# Patient Record
Sex: Male | Born: 1970
Health system: Southern US, Community
[De-identification: ages and names within clinical notes are randomized; demographics above are authoritative.]

## PROBLEM LIST (undated history)

## (undated) DIAGNOSIS — F32A Depression, unspecified: Secondary | ICD-10-CM

## (undated) DIAGNOSIS — E785 Hyperlipidemia, unspecified: Secondary | ICD-10-CM

## (undated) DIAGNOSIS — F419 Anxiety disorder, unspecified: Secondary | ICD-10-CM

## (undated) DIAGNOSIS — I10 Essential (primary) hypertension: Secondary | ICD-10-CM

## (undated) DIAGNOSIS — F329 Major depressive disorder, single episode, unspecified: Secondary | ICD-10-CM

## (undated) HISTORY — DX: Essential (primary) hypertension: I10

## (undated) HISTORY — DX: Depression, unspecified: F32.A

## (undated) HISTORY — DX: Hyperlipidemia, unspecified: E78.5

## (undated) HISTORY — DX: Anxiety disorder, unspecified: F41.9

## (undated) HISTORY — DX: Major depressive disorder, single episode, unspecified: F32.9

---
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
## 1997-10-09 ENCOUNTER — Emergency Department (HOSPITAL_COMMUNITY): Admission: EM | Admit: 1997-10-09 | Discharge: 1997-10-09 | Payer: Self-pay | Admitting: Emergency Medicine

## 1999-11-08 ENCOUNTER — Emergency Department (HOSPITAL_COMMUNITY): Admission: EM | Admit: 1999-11-08 | Discharge: 1999-11-08 | Payer: Self-pay | Admitting: *Deleted

## 1999-11-08 ENCOUNTER — Encounter: Payer: Self-pay | Admitting: Emergency Medicine

## 1999-11-15 ENCOUNTER — Emergency Department (HOSPITAL_COMMUNITY): Admission: EM | Admit: 1999-11-15 | Discharge: 1999-11-15 | Payer: Self-pay | Admitting: Emergency Medicine

## 2001-10-07 ENCOUNTER — Emergency Department (HOSPITAL_COMMUNITY): Admission: EM | Admit: 2001-10-07 | Discharge: 2001-10-07 | Payer: Self-pay | Admitting: Emergency Medicine

## 2010-02-20 ENCOUNTER — Ambulatory Visit: Payer: Self-pay | Admitting: Cardiovascular Disease

## 2010-03-05 ENCOUNTER — Ambulatory Visit (HOSPITAL_COMMUNITY): Admission: RE | Admit: 2010-03-05 | Discharge: 2010-03-05 | Payer: Self-pay | Admitting: Cardiovascular Disease

## 2010-03-05 ENCOUNTER — Encounter: Payer: Self-pay | Admitting: Cardiovascular Disease

## 2010-03-05 ENCOUNTER — Ambulatory Visit: Payer: Self-pay | Admitting: Cardiology

## 2010-03-05 ENCOUNTER — Ambulatory Visit: Payer: Self-pay

## 2010-03-06 ENCOUNTER — Telehealth: Payer: Self-pay | Admitting: Cardiovascular Disease

## 2010-07-10 NOTE — Progress Notes (Signed)
  Phone Note Outgoing Call   Call placed by: Dessie Coma  LPN,  March 06, 2010 2:51 PM Call placed to: Patient Summary of Call: Patient notified per Dr. Kirke Corin, stress echo was normal.

## 2010-10-23 NOTE — Letter (Signed)
February 20, 2010    Dr. Marisue Brooklyn  [address]  [place], [place]  [address]   RE:  Bernard, Thompson  MRN:  [id number]  /  DOB:  [birth year]   Dear Dr. Elisabeth Most:   Thank you for referring Bernard Thompson for cardiac evaluation regarding his  recent chest pain.  As you are aware, this is a 40 year old gentleman  with no previous cardiac history.  He has the following problem list:  1. Anxiety and depression.  2. Hyperlipidemia.  3. Borderline hypertension.   CLINICAL HISTORY:  Bernard Thompson had some episodes of chest pain recently over  the last few weeks.  He describes chest tightness substernally with no  radiation.  The initial episode happened about 10 days ago at rest and  was not triggered by physical activities.  It lasted for a few hours.  He was out of town recently and had another episode.  [facility] and was transferred to the emergency room at that time.  He was  kept overnight with serial cardiac enzymes and a chest x-Prosise.  The basic  workup was nonrevealing.  It was recommended to him to get a stress  test.  The patient has no previous cardiac history.  He had a few milder  episodes since the emergency room visit but feels that it is triggered  by stress and anxiety.  He is able to do his physical activities without  significant limitations.  He does have mild exertional dyspnea.  He was  found recently to have hyperlipidemia and was prescribed Pravachol.  He  has not been taking the medication.   PAST MEDICAL HISTORY:  Is as outlined above.   MEDICATIONS:  Include citalopram 20 mg once daily.   ALLERGIES:  NO KNOWN DRUG ALLERGIES.   SOCIAL HISTORY:  He quit smoking in 1992.  He denies any alcohol or  recreational drug use.  The patient does not exercise.  He works as a  [employer]al [employer].   FAMILY HISTORY:  Is negative for premature coronary artery disease.   PAST SURGICAL HISTORY:  Foot surgery in 1985.   REVIEW OF SYSTEMS:  Is remarkable for chest pain and  mild dyspnea.  There is also anxiety.  He does have mild heartburn.  Full review of  systems was performed and is otherwise negative.   PHYSICAL EXAMINATION:  GENERAL:  The patient is pleasant and appears to  be in no acute distress.  VITAL SIGNS:  Weight 257, blood pressure is 155/92, pulse is 65, oxygen  saturation is 98% on room air.  HEENT:  Normocephalic, atraumatic.  NECK:  No JVD or carotid bruits.  RESPIRATORY:  Normal respiratory effort with no use of accessory  muscles.  Auscultation reveals normal breath sounds.  CARDIOVASCULAR:  Normal PMI.  Normal S1, S2 with no gallops or murmurs.  ABDOMEN:  Benign, nontender, nondistended.  EXTREMITIES:  With no clubbing, cyanosis or edema.  SKIN:  Warm and dry with no rash.  MUSCULOSKELETAL:  There is normal muscle strength in the upper and lower  extremities.   IMPRESSION:  1. Atypical chest pain:  This could be due to anxiety and stress.  He      also does complain of increased heartburn recently.  Nonetheless, I      agree that cardiac etiology will need to be ruled out.  He does      have recent diagnosis of hyperlipidemia and his blood pressure has  been borderline elevated.  Thus, I recommend proceeding with a      stress echocardiogram for further evaluation.  His resting EKG      shows normal sinus rhythm with no significant ST or T-wave changes.  2. Hyperlipidemia.  His recent lipid profile showed total cholesterol      of 247, triglyceride 229, HDL of 49 and LDL of 153.  He was started      on Pravachol.  However, he has not been taking the medication.  I      discussed with him the importance of taking medications as well as      lifestyle modifications.  Obviously, he is obese and is not      following a healthy diet.  He has not been exercising regularly as      well.  Once cardiac workup is performed, I recommended him to start      an exercise program and try to lose weight.  3. Elevated blood pressure:  The  patient does not have an official      diagnosis of hypertension.  If his blood pressure remains to be      elevated on subsequent visits, he might need treatment.  In the      meantime, this was discussed with him.  I recommended low-sodium      diet, exercise program and attempted weight loss.  The patient will      be called with the results of the stress test.   Thank you for allowing me to participate in the care of your patient.    Sincerely,      Lorine Bears, MD  Electronically Signed    MA/MedQ  DD: 02/20/2010  DT: 02/20/2010  Job #: [id number]

## 2011-02-05 ENCOUNTER — Encounter: Payer: Self-pay | Admitting: Cardiovascular Disease

## 2011-02-27 ENCOUNTER — Encounter: Payer: Self-pay | Admitting: Cardiovascular Disease

## 2013-06-14 ENCOUNTER — Other Ambulatory Visit: Payer: Self-pay | Admitting: Emergency Medicine

## 2013-06-16 ENCOUNTER — Other Ambulatory Visit: Payer: Self-pay | Admitting: Emergency Medicine

## 2013-06-16 MED ORDER — CITALOPRAM HYDROBROMIDE 40 MG PO TABS: 40.0000 mg | ORAL_TABLET | Freq: Every day | ORAL | Status: DC

## 2013-06-16 MED ORDER — LAMOTRIGINE 100 MG PO TABS: 100.0000 mg | ORAL_TABLET | Freq: Every day | ORAL | Status: DC

## 2013-06-16 MED ORDER — TESTOSTERONE CYPIONATE 200 MG/ML IM SOLN
INTRAMUSCULAR | Status: DC
Start: 2013-06-16 — End: 2013-10-08

## 2013-06-16 MED ORDER — METFORMIN HCL 500 MG PO TABS: 500.0000 mg | ORAL_TABLET | Freq: Two times a day (BID) | ORAL | Status: DC

## 2013-10-07 ENCOUNTER — Other Ambulatory Visit: Payer: Self-pay | Admitting: Emergency Medicine

## 2013-10-08 ENCOUNTER — Other Ambulatory Visit: Payer: Self-pay | Admitting: Emergency Medicine

## 2013-10-08 MED ORDER — CITALOPRAM HYDROBROMIDE 40 MG PO TABS: 40.0000 mg | ORAL_TABLET | Freq: Every day | ORAL | Status: DC

## 2013-10-08 MED ORDER — TESTOSTERONE CYPIONATE 200 MG/ML IM SOLN: INTRAMUSCULAR | Status: DC

## 2013-10-08 MED ORDER — LAMOTRIGINE 100 MG PO TABS: 100.0000 mg | ORAL_TABLET | Freq: Every day | ORAL | Status: DC

## 2014-04-05 ENCOUNTER — Encounter: Payer: Self-pay | Admitting: Emergency Medicine

## 2014-04-06 ENCOUNTER — Encounter: Payer: Self-pay | Admitting: Emergency Medicine

## 2015-05-30 ENCOUNTER — Encounter: Payer: Self-pay | Admitting: Internal Medicine

## 2015-05-30 ENCOUNTER — Ambulatory Visit (INDEPENDENT_AMBULATORY_CARE_PROVIDER_SITE_OTHER): Payer: BLUE CROSS/BLUE SHIELD | Admitting: Internal Medicine

## 2015-05-30 VITALS — BP 140/96 | HR 76 | Temp 98.0°F | Resp 18 | Ht 73.0 in | Wt 268.0 lb

## 2015-05-30 DIAGNOSIS — Z125 Encounter for screening for malignant neoplasm of prostate: Secondary | ICD-10-CM | POA: Diagnosis not present

## 2015-05-30 DIAGNOSIS — F329 Major depressive disorder, single episode, unspecified: Secondary | ICD-10-CM

## 2015-05-30 DIAGNOSIS — Z79899 Other long term (current) drug therapy: Secondary | ICD-10-CM | POA: Diagnosis not present

## 2015-05-30 DIAGNOSIS — Z0000 Encounter for general adult medical examination without abnormal findings: Secondary | ICD-10-CM

## 2015-05-30 DIAGNOSIS — N529 Male erectile dysfunction, unspecified: Secondary | ICD-10-CM

## 2015-05-30 DIAGNOSIS — E785 Hyperlipidemia, unspecified: Secondary | ICD-10-CM

## 2015-05-30 DIAGNOSIS — E291 Testicular hypofunction: Secondary | ICD-10-CM

## 2015-05-30 DIAGNOSIS — E559 Vitamin D deficiency, unspecified: Secondary | ICD-10-CM | POA: Diagnosis not present

## 2015-05-30 DIAGNOSIS — Z1389 Encounter for screening for other disorder: Secondary | ICD-10-CM

## 2015-05-30 DIAGNOSIS — Z131 Encounter for screening for diabetes mellitus: Secondary | ICD-10-CM

## 2015-05-30 DIAGNOSIS — F32A Depression, unspecified: Secondary | ICD-10-CM

## 2015-05-30 DIAGNOSIS — Z130 Encounter for screening for diseases of the blood and blood-forming organs and certain disorders involving the immune mechanism: Secondary | ICD-10-CM

## 2015-05-30 DIAGNOSIS — I10 Essential (primary) hypertension: Secondary | ICD-10-CM

## 2015-05-30 DIAGNOSIS — Z0001 Encounter for general adult medical examination with abnormal findings: Secondary | ICD-10-CM

## 2015-05-30 LAB — BASIC METABOLIC PANEL WITH GFR
BUN: 13 mg/dL (ref 7–25)
CO2: 24 mmol/L (ref 20–31)
CREATININE: 1.15 mg/dL (ref 0.60–1.35)
Calcium: 9.5 mg/dL (ref 8.6–10.3)
Chloride: 103 mmol/L (ref 98–110)
GFR, Est African American: 88 mL/min (ref >=60)
GFR, Est Non African American: 76 mL/min (ref >=60)
Glucose, Bld: 86 mg/dL (ref 65–99)
Potassium: 4.6 mmol/L (ref 3.5–5.3)
SODIUM: 137 mmol/L (ref 135–146)

## 2015-05-30 LAB — HEPATIC FUNCTION PANEL
ALT: 37 U/L (ref 9–46)
AST: 27 U/L (ref 10–40)
Albumin: 4.7 g/dL (ref 3.6–5.1)
Alkaline Phosphatase: 82 U/L (ref 40–115)
BILIRUBIN DIRECT: 0.2 mg/dL (ref <=0.2)
BILIRUBIN INDIRECT: 1.1 mg/dL (ref 0.2–1.2)
Total Bilirubin: 1.3 mg/dL — ABNORMAL HIGH (ref 0.2–1.2)
Total Protein: 6.9 g/dL (ref 6.1–8.1)

## 2015-05-30 LAB — MAGNESIUM: MAGNESIUM: 2.2 mg/dL (ref 1.5–2.5)

## 2015-05-30 LAB — HEMOGLOBIN A1C
HEMOGLOBIN A1C: 5.6 % (ref <5.7)
MEAN PLASMA GLUCOSE: 114 mg/dL (ref <117)

## 2015-05-30 LAB — LIPID PANEL
Cholesterol: 171 mg/dL (ref 125–200)
HDL: 44 mg/dL (ref >=40)
LDL Cholesterol: 94 mg/dL (ref <130)
Total CHOL/HDL Ratio: 3.9 Ratio (ref <=5.0)
Triglycerides: 165 mg/dL — ABNORMAL HIGH (ref <150)
VLDL: 33 mg/dL — AB (ref <30)

## 2015-05-30 LAB — IRON AND TIBC
%SAT: 40 % (ref 15–60)
IRON: 139 ug/dL (ref 50–180)
TIBC: 344 ug/dL (ref 250–425)
UIBC: 205 ug/dL (ref 125–400)

## 2015-05-30 MED ORDER — OLMESARTAN MEDOXOMIL 40 MG PO TABS: 40.0000 mg | ORAL_TABLET | Freq: Every day | ORAL | Status: DC

## 2015-05-30 MED ORDER — ALPRAZOLAM 0.5 MG PO TABS: 0.5000 mg | ORAL_TABLET | Freq: Two times a day (BID) | ORAL | Status: DC | PRN

## 2015-05-30 MED ORDER — PRAVASTATIN SODIUM 40 MG PO TABS: 40.0000 mg | ORAL_TABLET | Freq: Every day | ORAL | Status: DC

## 2015-05-30 MED ORDER — CITALOPRAM HYDROBROMIDE 40 MG PO TABS: 40.0000 mg | ORAL_TABLET | Freq: Every day | ORAL | Status: DC

## 2015-05-30 NOTE — Patient Instructions (Signed)
Aripiprazole tablets What is this medicine? ARIPIPRAZOLE (ay ri PIP Marlar zole) is an atypical antipsychotic. It is used to treat schizophrenia and bipolar disorder, also known as manic-depression. It is also used to treat Tourette's disorder and some symptoms of autism. This medicine may also be used in combination with antidepressants to treat major depressive disorder. This medicine may be used for other purposes; ask your health care provider or pharmacist if you have questions. What should I tell my health care provider before I take this medicine? They need to know if you have any of these conditions: -dehydration -dementia -diabetes -heart disease -history of stroke -low blood counts, like low white cell, platelet, or red cell counts -Parkinson's disease -seizures -suicidal thoughts, plans, or attempt; a previous suicide attempt by you or a family member -an unusual or allergic reaction to aripiprazole, other medicines, foods, dyes, or preservatives -pregnant or trying to get pregnant -breast-feeding How should I use this medicine? Take this medicine by mouth with a glass of water. Follow the directions on the prescription label. You can take this medicine with or without food. Take your doses at regular intervals. Do not take your medicine more often than directed. Do not stop taking except on the advice of your doctor or health care professional. A special MedGuide will be given to you by the pharmacist with each prescription and refill. Be sure to read this information carefully each time. Talk to your pediatrician regarding the use of this medicine in children. While this drug may be prescribed for children as young as 6 years of age for selected conditions, precautions do apply. Overdosage: If you think you have taken too much of this medicine contact a poison control center or emergency room at once. NOTE: This medicine is only for you. Do not share this medicine with others. What  if I miss a dose? If you miss a dose, take it as soon as you can. If it is almost time for your next dose, take only that dose. Do not take double or extra doses. What may interact with this medicine? Do not take this medicine with any of the following medications: -certain medicines for fungal infections like fluconazole, itraconazole, ketoconazole, posaconazole, voriconazole -cisapride -dofetilide -dronedarone -pimozide -thioridazine -ziprasidone This medicine may also interact with the following medications: -carbamazepine -certain medicines for blood pressure -erythromycin -fluoxetine -grapefruit juice -other medicines that prolong the QT interval (cause an abnormal heart rhythm) -paroxetine -quinidine -valproic acid This list may not describe all possible interactions. Give your health care provider a list of all the medicines, herbs, non-prescription drugs, or dietary supplements you use. Also tell them if you smoke, drink alcohol, or use illegal drugs. Some items may interact with your medicine. What should I watch for while using this medicine? Visit your doctor or health care professional for regular checks on your progress. It may be several weeks before you see the full effects of this medicine. Do not suddenly stop taking this medicine. You may need to gradually reduce the dose. Patients and their families should watch out for worsening depression or thoughts of suicide. Also watch out for sudden changes in feelings such as feeling anxious, agitated, panicky, irritable, hostile, aggressive, impulsive, severely restless, overly excited and hyperactive, or not being able to sleep. If this happens, especially at the beginning of antidepressant treatment or after a change in dose, call your health care professional. You may get dizzy or drowsy. Do not drive, use machinery, or do anything that needs   mental alertness until you know how this medicine affects you. Do not stand or sit up  quickly, especially if you are an older patient. This reduces the risk of dizzy or fainting spells. Alcohol can increase dizziness and drowsiness. Avoid alcoholic drinks. This medicine can reduce the response of your body to heat or cold. Dress warm in cold weather and stay hydrated in hot weather. If possible, avoid extreme temperatures like saunas, hot tubs, very hot or cold showers, or activities that can cause dehydration such as vigorous exercise. This medicine may cause dry eyes and blurred vision. If you wear contact lenses you may feel some discomfort. Lubricating drops may help. See your eye doctor if the problem does not go away or is severe. If you notice an increased hunger or thirst, different from your normal hunger or thirst, or if you find that you have to urinate more frequently, you should contact your health care provider as soon as possible. You may need to have your blood sugar monitored. This medicine may cause changes in your blood sugar levels. You should monitor you blood sugar frequently if you have diabetes. There have been reports of uncontrollable and strong urges to gamble, binge eat, shop, and have sex while taking this medicine. If you experience any of these or other uncontrollable and strong urges while taking this medicine, you should report it to your health care provider as soon as possible. What side effects may I notice from receiving this medicine? Side effects that you should report to your doctor or health care professional as soon as possible: -allergic reactions like skin rash, itching or hives, swelling of the face, lips, or tongue -breathing problems -confusion -feeling faint or lightheaded, falls -fever or chills, sore throat -increased hunger or thirst -increased urination -joint pain -muscles pain, spasms -problems with balance, talking, walking -restlessness or need to keep moving -seizures -suicidal thoughts or other mood changes -trouble  swallowing -uncontrollable and excessive urges (examples: gambling, binge eating, shopping, having sex) -uncontrollable head, mouth, neck, arm, or leg movements -unusually weak or tired Side effects that usually do not require medical attention (report to your doctor or health care professional if they continue or are bothersome): -blurred vision -constipation -headache -nausea, vomiting -trouble sleeping -weight gain This list may not describe all possible side effects. Call your doctor for medical advice about side effects. You may report side effects to FDA at 1-800-FDA-1088. Where should I keep my medicine? Keep out of the reach of children. Store at room temperature between 15 and 30 degrees C (59 and 86 degrees F). Throw away any unused medicine after the expiration date. NOTE: This sheet is a summary. It may not cover all possible information. If you have questions about this medicine, talk to your doctor, pharmacist, or health care provider.    2016, Elsevier/Gold Standard. (2014-10-13 15:37:42)  

## 2015-05-30 NOTE — Progress Notes (Signed)
Patient ID: Bernard Thompson, male   DOB: 07/04/1970, 44 y.o.   MRN: [id number]    Annual Screening Comprehensive Examination   This very nice 44 y.o.male presents for complete physical and restablishment as a patient. He did close his chart when he moved to [place] and has recently moved back.  He does have a history of anxiety, depression, HTN, Hyperlipidemia.  He and his wife report that there is a lack of intimacy as well.    His last visit with a doctor was [facility] outside the facility.   He has not had a physical in the last year.    Patient reports that for the past month he has been out of his blood pressure medication.  He was taking benicar and without insurance for a while it was too expensive.  He reports that when he was on the benicar the blood pressure was pretty well controlled.  He denies headaches, chest pain, palpitations.    He also has a history of high cholesterol.  He has been on pravastatin and he is currently taking it.  He reports that it has been a while since the cholesterol was checked.  He reports that he does not have any muscle aches or cramps.    He does have a history of anxiety and depression and is currently on the citalopram.  He reports that for the past several months his anxiety and depression have not been well controlled.  He has some anhedonia, he has a hard time sleeping currently, he is very fatigued.  He does have severe mood swings as well.  He reports that he was taking lamictal at one time.  He reports that he couldn't tell the difference in taking the medication.  He has taken buspar and also wellbutrin without much relief and anger issues.  He reports that he occasionally takes xanax on an as needed basis.  He has not taken it in a while because he ran out.    He did previously take testosterone injections. He was self injecting at home.  He was injecting 2 cc every 3 weeks to a month.  It has been a long time since he has used it.    Finally,  patient has history of Vitamin D Deficiency and last vitamin D was No results found for: VD25OH.  Currently on supplementation     Current Outpatient Prescriptions on File Prior to Visit  Medication Sig Dispense Refill  . citalopram (CELEXA) 40 MG tablet Take 1 tablet (40 mg total) by mouth daily. 30 tablet 0  . testosterone cypionate (DEPOTESTOTERONE CYPIONATE) 200 MG/ML injection Inject 2cc every 2 weeks (Patient not taking: Reported on 05/30/2015) 10 mL 0   No current facility-administered medications on file prior to visit.    Allergies no known allergies  Past Medical History  Diagnosis Date  . Anxiety   . Depression   . HLD (hyperlipidemia)   . HTN (hypertension)      There is no immunization history on file for this patient.  Past Surgical History  Procedure Laterality Date  . Foot fracture surgery  1985    No family history on file.  Social History   Social History  . Marital Status: Single    Spouse Name: N/A  . Number of Children: 1  . Years of Education: N/A   Occupational History  . Truck driver    Social History Main Topics  . Smoking status: Former Smoker  Quit date: 06/10/1990  . Smokeless tobacco: Not on file  . Alcohol Use: No  . Drug Use: No  . Sexual Activity: Not on file   Other Topics Concern  . Not on file   Social History Narrative    Review of Systems  Constitutional: Negative for fever, chills and malaise/fatigue.  HENT: Negative for congestion, ear pain and sore throat.   Eyes: Negative.   Respiratory: Negative for cough, shortness of breath and wheezing.   Cardiovascular: Negative for chest pain, palpitations and leg swelling.  Gastrointestinal: Negative for heartburn, diarrhea, constipation, blood in stool and melena.  Genitourinary: Positive for urgency. Negative for dysuria, frequency and hematuria.  Skin: Negative.   Neurological: Negative for dizziness, sensory change, loss of consciousness and headaches.   Psychiatric/Behavioral: Positive for depression and memory loss. The patient is nervous/anxious and has insomnia.       Physical Exam  BP 140/96 mmHg  Pulse 76  Temp(Src) 98 F (36.7 C) (Temporal)  Resp 18  Ht 6\' 1"  (1.854 m)  Wt 268 lb (121.564 kg)  BMI 35.37 kg/m2  General Appearance: Well nourished and in no apparent distress. Eyes: PERRLA, EOMs, conjunctiva no swelling or erythema, normal fundi and vessels. Sinuses: No frontal/maxillary tenderness ENT/Mouth: EACs patent / TMs  nl. Nares clear without erythema, swelling, mucoid exudates. Oral hygiene is good. No erythema, swelling, or exudate. Tongue normal, non-obstructing. Tonsils not swollen or erythematous. Hearing normal.  Neck: Supple, thyroid normal. No bruits, nodes or JVD. Respiratory: Respiratory effort normal.  BS equal and clear bilateral without rales, rhonci, wheezing or stridor. Cardio: Heart sounds are normal with regular rate and rhythm and no murmurs, rubs or gallops. Peripheral pulses are normal and equal bilaterally without edema. No aortic or femoral bruits. Chest: symmetric with normal excursions and percussion. Abdomen: Flat, soft, with bowl sounds. Nontender, no guarding, rebound, hernias, masses, or organomegaly.  Lymphatics: Non tender without lymphadenopathy.  Genitourinary: Deferred based on patient preference Musculoskeletal: Full ROM all peripheral extremities, joint stability, 5/5 strength, and normal gait. Skin: Warm and dry without rashes, lesions, cyanosis, clubbing or  ecchymosis.  Neuro: Cranial nerves intact, reflexes equal bilaterally. Normal muscle tone, no cerebellar symptoms. Sensation intact.  Pysch: Awake and oriented X 3, normal affect, Insight and Judgment appropriate.   Assessment and Plan   1. Encounter for general adult medical examination with abnormal findings   2. Essential hypertension -refilled Benicar, prescription card given -keep BP log -recheck 1 month -  Urinalysis, Routine w reflex microscopic ([facility]) - Microalbumin / creatinine urine ratio - TSH  3. Hyperlipidemia  - Lipid panel  4. Medication management  - CBC with Differential/Platelet - BASIC METABOLIC PANEL WITH GFR - Hepatic function panel - Magnesium  5. Screening for diabetes mellitus  - Hemoglobin A1c - Insulin, random  6. Screening for prostate cancer  - PSA  7. Screening for deficiency anemia  - Iron and TIBC - Vitamin B12  8. Hypogonadism in male  - Testosterone  9. Screening for hematuria or proteinuria  - Urinalysis, Routine w reflex microscopic ([facility]) - Microalbumin / creatinine urine ratio  10. Vitamin D deficiency  - VITAMIN D 25 Hydroxy (Vit-D Deficiency, Fractures)  11. Erectile dysfunction, unspecified erectile dysfunction type -viagra samples given -likely secondary to depression  12. Depression poorly controlled -may need another add on like abilify -referral to psych offered    Continue prudent diet as discussed, weight control, regular exercise, and medications. Routine screening labs and  tests as requested with regular follow-up as recommended.  Over 40 minutes of exam, counseling, chart review and critical decision making was performed

## 2015-05-31 LAB — URINALYSIS, ROUTINE W REFLEX MICROSCOPIC
Bilirubin Urine: NEGATIVE
Glucose, UA: NEGATIVE
Hgb urine dipstick: NEGATIVE
Ketones, ur: NEGATIVE
LEUKOCYTES UA: NEGATIVE
NITRITE: NEGATIVE
PH: 6 (ref 5.0–8.0)
Protein, ur: NEGATIVE
SPECIFIC GRAVITY, URINE: 1.02 (ref 1.001–1.035)

## 2015-05-31 LAB — CBC WITH DIFFERENTIAL/PLATELET
BASOS PCT: 2 % — AB (ref 0–1)
Basophils Absolute: 0.2 10*3/uL — ABNORMAL HIGH (ref 0.0–0.1)
Eosinophils Absolute: 0.3 10*3/uL (ref 0.0–0.7)
Eosinophils Relative: 4 % (ref 0–5)
HCT: 46.5 % (ref 39.0–52.0)
Hemoglobin: 16 g/dL (ref 13.0–17.0)
LYMPHS ABS: 2.2 10*3/uL (ref 0.7–4.0)
LYMPHS PCT: 28 % (ref 12–46)
MCH: 29.6 pg (ref 26.0–34.0)
MCHC: 34.4 g/dL (ref 30.0–36.0)
MCV: 86.1 fL (ref 78.0–100.0)
MPV: 9.9 fL (ref 8.6–12.4)
Monocytes Absolute: 0.5 10*3/uL (ref 0.1–1.0)
Monocytes Relative: 6 % (ref 3–12)
NEUTROS PCT: 60 % (ref 43–77)
Neutro Abs: 4.7 10*3/uL (ref 1.7–7.7)
Platelets: 325 10*3/uL (ref 150–400)
RBC: 5.4 MIL/uL (ref 4.22–5.81)
RDW: 14 % (ref 11.5–15.5)
WBC: 7.9 10*3/uL (ref 4.0–10.5)

## 2015-05-31 LAB — VITAMIN D 25 HYDROXY (VIT D DEFICIENCY, FRACTURES): Vit D, 25-Hydroxy: 26 ng/mL — ABNORMAL LOW (ref 30–100)

## 2015-05-31 LAB — PSA: PSA: 0.94 ng/mL (ref <=4.00)

## 2015-05-31 LAB — INSULIN, RANDOM: Insulin: 14.5 u[IU]/mL (ref 2.0–19.6)

## 2015-05-31 LAB — TESTOSTERONE: Testosterone: 259 ng/dL — ABNORMAL LOW (ref 300–890)

## 2015-05-31 LAB — MICROALBUMIN / CREATININE URINE RATIO
Creatinine, Urine: 166 mg/dL (ref 20–370)
MICROALB UR: 0.5 mg/dL
MICROALB/CREAT RATIO: 3 ug/mg{creat} (ref <30)

## 2015-05-31 LAB — TSH: TSH: 1.584 u[IU]/mL (ref 0.350–4.500)

## 2015-05-31 LAB — VITAMIN B12: Vitamin B-12: 810 pg/mL (ref 211–911)

## 2015-07-03 ENCOUNTER — Ambulatory Visit: Payer: Self-pay | Admitting: Internal Medicine

## 2015-11-30 ENCOUNTER — Other Ambulatory Visit: Payer: Self-pay | Admitting: *Deleted

## 2015-11-30 MED ORDER — OLMESARTAN MEDOXOMIL 40 MG PO TABS: 40.0000 mg | ORAL_TABLET | Freq: Every day | ORAL | Status: DC

## 2016-02-28 ENCOUNTER — Other Ambulatory Visit: Payer: Self-pay | Admitting: Internal Medicine

## 2016-03-05 ENCOUNTER — Encounter: Payer: Self-pay | Admitting: Internal Medicine

## 2016-03-05 ENCOUNTER — Ambulatory Visit (INDEPENDENT_AMBULATORY_CARE_PROVIDER_SITE_OTHER): Payer: 59 | Admitting: Internal Medicine

## 2016-03-05 VITALS — BP 110/70 | HR 80 | Temp 98.2°F | Resp 18 | Ht 73.0 in | Wt 280.0 lb

## 2016-03-05 DIAGNOSIS — Z79899 Other long term (current) drug therapy: Secondary | ICD-10-CM

## 2016-03-05 DIAGNOSIS — R7303 Prediabetes: Secondary | ICD-10-CM

## 2016-03-05 DIAGNOSIS — E785 Hyperlipidemia, unspecified: Secondary | ICD-10-CM | POA: Diagnosis not present

## 2016-03-05 DIAGNOSIS — F411 Generalized anxiety disorder: Secondary | ICD-10-CM | POA: Diagnosis not present

## 2016-03-05 DIAGNOSIS — Z23 Encounter for immunization: Secondary | ICD-10-CM | POA: Diagnosis not present

## 2016-03-05 DIAGNOSIS — I10 Essential (primary) hypertension: Secondary | ICD-10-CM | POA: Diagnosis not present

## 2016-03-05 LAB — BASIC METABOLIC PANEL WITH GFR
BUN: 13 mg/dL (ref 7–25)
CO2: 27 mmol/L (ref 20–31)
Calcium: 9.5 mg/dL (ref 8.6–10.3)
Chloride: 103 mmol/L (ref 98–110)
Creat: 1.35 mg/dL (ref 0.60–1.35)
GFR, Est African American: 72 mL/min (ref >=60)
GFR, Est Non African American: 63 mL/min (ref >=60)
Glucose, Bld: 112 mg/dL — ABNORMAL HIGH (ref 65–99)
POTASSIUM: 4.5 mmol/L (ref 3.5–5.3)
Sodium: 137 mmol/L (ref 135–146)

## 2016-03-05 LAB — LIPID PANEL
CHOL/HDL RATIO: 4.2 ratio (ref <=5.0)
CHOLESTEROL: 164 mg/dL (ref 125–200)
HDL: 39 mg/dL — ABNORMAL LOW (ref >=40)
LDL Cholesterol: 96 mg/dL (ref <130)
TRIGLYCERIDES: 147 mg/dL (ref <150)
VLDL: 29 mg/dL (ref <30)

## 2016-03-05 LAB — HEPATIC FUNCTION PANEL
ALBUMIN: 4.5 g/dL (ref 3.6–5.1)
ALT: 33 U/L (ref 9–46)
AST: 25 U/L (ref 10–40)
Alkaline Phosphatase: 69 U/L (ref 40–115)
BILIRUBIN TOTAL: 0.9 mg/dL (ref 0.2–1.2)
Bilirubin, Direct: 0.2 mg/dL (ref <=0.2)
Indirect Bilirubin: 0.7 mg/dL (ref 0.2–1.2)
Total Protein: 6.6 g/dL (ref 6.1–8.1)

## 2016-03-05 LAB — CBC WITH DIFFERENTIAL/PLATELET
BASOS PCT: 1 %
Basophils Absolute: 79 cells/uL (ref 0–200)
EOS ABS: 316 {cells}/uL (ref 15–500)
Eosinophils Relative: 4 %
HEMATOCRIT: 46.2 % (ref 38.5–50.0)
Hemoglobin: 16.3 g/dL (ref 13.2–17.1)
LYMPHS ABS: 1896 {cells}/uL (ref 850–3900)
Lymphocytes Relative: 24 %
MCH: 29.6 pg (ref 27.0–33.0)
MCHC: 35.3 g/dL (ref 32.0–36.0)
MCV: 84 fL (ref 80.0–100.0)
MONO ABS: 474 {cells}/uL (ref 200–950)
MONOS PCT: 6 %
MPV: 9.6 fL (ref 7.5–12.5)
NEUTROS ABS: 5135 {cells}/uL (ref 1500–7800)
Neutrophils Relative %: 65 %
PLATELETS: 261 10*3/uL (ref 140–400)
RBC: 5.5 MIL/uL (ref 4.20–5.80)
RDW: 13.1 % (ref 11.0–15.0)
WBC: 7.9 10*3/uL (ref 3.8–10.8)

## 2016-03-05 LAB — TSH: TSH: 2.74 m[IU]/L (ref 0.40–4.50)

## 2016-03-05 MED ORDER — OLMESARTAN MEDOXOMIL 40 MG PO TABS: 40.0000 mg | ORAL_TABLET | Freq: Every day | ORAL | 0 refills | Status: DC

## 2016-03-05 MED ORDER — CITALOPRAM HYDROBROMIDE 40 MG PO TABS: 40.0000 mg | ORAL_TABLET | Freq: Every day | ORAL | 2 refills | Status: DC

## 2016-03-05 MED ORDER — ALPRAZOLAM 0.5 MG PO TABS: 0.5000 mg | ORAL_TABLET | Freq: Two times a day (BID) | ORAL | 0 refills | Status: DC | PRN

## 2016-03-05 MED ORDER — PRAVASTATIN SODIUM 40 MG PO TABS
40.0000 mg | ORAL_TABLET | Freq: Every day | ORAL | 1 refills | Status: DC
Start: 2016-03-05 — End: 2016-09-11

## 2016-03-05 NOTE — Progress Notes (Signed)
Assessment and Plan:  Hypertension:  -Continue medication,  -monitor blood pressure at home.  -Continue DASH diet.   -Reminder to go to the ER if any CP, SOB, nausea, dizziness, severe HA, changes vision/speech, left arm numbness and tingling, and jaw pain.  Cholesterol: -Continue diet and exercise.  -Check cholesterol.   Pre-diabetes: -Continue diet and exercise.  -Check A1C  Vitamin D Def: -check level -continue medications.   Anxiety and Depression -xanax refilled -recommended that if in the future the [employer] physicians have an issue with meds to call us -no evidence of abuse of medications  Continue diet and meds as discussed. Further disposition pending results of labs.  HPI 45 y.o. male  presents for 3 month follow up with hypertension, hyperlipidemia, prediabetes and vitamin D.   His blood pressure has been controlled at home, today their BP is BP: 110/70.   He does not workout. He denies chest pain, shortness of breath, dizziness.      He is on cholesterol medication and denies myalgias. His cholesterol is at goal. The cholesterol last visit was:   Lab Results  Component Value Date   CHOL 171 05/30/2015   HDL 44 05/30/2015   LDLCALC 94 05/30/2015   TRIG 165 (H) 05/30/2015   CHOLHDL 3.9 05/30/2015     He has been working on diet and exercise for prediabetes, and denies foot ulcerations, hyperglycemia, hypoglycemia , increased appetite, nausea, paresthesia of the feet, polydipsia, polyuria, visual disturbances, vomiting and weight loss. Last A1C in the office was:  Lab Results  Component Value Date   HGBA1C 5.6 05/30/2015    Patient is on Vitamin D supplement.  Lab Results  Component Value Date   VD25OH 26 (L) 05/30/2015     He reports that he has some issues with anxiety.  He reports that he rarely uses the xanax.  He reports that he had half a bottle left.  He reports that he did have to throw his xanax away due to his visit with a [employer] physical doctor.     Current Medications:  Current Outpatient Prescriptions on File Prior to Visit  Medication Sig Dispense Refill  . ALPRAZolam (XANAX) 0.5 MG tablet Take 1 tablet (0.5 mg total) by mouth 2 (two) times daily as needed for anxiety. 60 tablet 0  . citalopram (CELEXA) 40 MG tablet Take 1 tablet (40 mg total) by mouth daily. 90 tablet 2  . olmesartan (BENICAR) 40 MG tablet Take 1 tablet (40 mg total) by mouth daily. 90 tablet 0  . pravastatin (PRAVACHOL) 40 MG tablet Take 1 tablet (40 mg total) by mouth daily. 90 tablet 1   No current facility-administered medications on file prior to visit.     Medical History:  Past Medical History:  Diagnosis Date  . Anxiety   . Depression   . HLD (hyperlipidemia)   . HTN (hypertension)     Allergies: Allergies no known allergies   Review of Systems:  Review of Systems  Constitutional: Negative for chills, fever and malaise/fatigue.  HENT: Negative for congestion, ear pain and sore throat.   Eyes: Negative.   Respiratory: Negative for cough, shortness of breath and wheezing.   Cardiovascular: Negative for chest pain, palpitations and leg swelling.  Gastrointestinal: Negative for abdominal pain, blood in stool, constipation, diarrhea, heartburn and melena.  Genitourinary: Negative.   Skin: Negative.   Neurological: Negative for dizziness, sensory change, loss of consciousness and headaches.  Psychiatric/Behavioral: Negative for depression. The patient is not nervous/anxious and  does not have insomnia.     Family history- Review and unchanged  Social history- Review and unchanged  Physical Exam: BP 110/70   Pulse 80   Temp 98.2 F (36.8 C) (Temporal)   Resp 18   Ht 6\' 1"  (1.854 m)   Wt 280 lb (127 kg)   BMI 36.94 kg/m  Wt Readings from Last 3 Encounters:  03/05/16 280 lb (127 kg)  05/30/15 268 lb (121.6 kg)    General Appearance: Well nourished well developed, in no apparent distress. Eyes: PERRLA, EOMs, conjunctiva no swelling or  erythema ENT/Mouth: Ear canals normal without obstruction, swelling, erythma, discharge.  TMs normal bilaterally.  Oropharynx moist, clear, without exudate, or postoropharyngeal swelling. Neck: Supple, thyroid normal,no cervical adenopathy  Respiratory: Respiratory effort normal, Breath sounds clear A&P without rhonchi, wheeze, or rale.  No retractions, no accessory usage. Cardio: RRR with no MRGs. Brisk peripheral pulses without edema.  Abdomen: Soft, + BS,  Non tender, no guarding, rebound, hernias, masses. Musculoskeletal: Full ROM, 5/5 strength, Normal gait Skin: Warm, dry without rashes, lesions, ecchymosis.  Neuro: Awake and oriented X 3, Cranial nerves intact. Normal muscle tone, no cerebellar symptoms. Psych: Normal affect, Insight and Judgment appropriate.    Terri Piedraourtney Forcucci, PA-C 3:22 PM [facility]

## 2016-03-06 LAB — HEMOGLOBIN A1C
Hgb A1c MFr Bld: 5.3 % (ref <5.7)
MEAN PLASMA GLUCOSE: 105 mg/dL

## 2016-05-21 ENCOUNTER — Ambulatory Visit: Payer: Self-pay | Admitting: Internal Medicine

## 2016-05-27 ENCOUNTER — Ambulatory Visit: Payer: Self-pay | Admitting: Internal Medicine

## 2016-05-28 ENCOUNTER — Encounter: Payer: Self-pay | Admitting: Physician Assistant

## 2016-05-28 ENCOUNTER — Ambulatory Visit (INDEPENDENT_AMBULATORY_CARE_PROVIDER_SITE_OTHER): Payer: 59 | Admitting: Physician Assistant

## 2016-05-28 VITALS — BP 118/64 | HR 96 | Temp 97.5°F | Resp 16 | Ht 73.0 in | Wt 280.2 lb

## 2016-05-28 DIAGNOSIS — R5383 Other fatigue: Secondary | ICD-10-CM

## 2016-05-28 DIAGNOSIS — E291 Testicular hypofunction: Secondary | ICD-10-CM | POA: Insufficient documentation

## 2016-05-28 DIAGNOSIS — E559 Vitamin D deficiency, unspecified: Secondary | ICD-10-CM | POA: Insufficient documentation

## 2016-05-28 DIAGNOSIS — I10 Essential (primary) hypertension: Secondary | ICD-10-CM | POA: Insufficient documentation

## 2016-05-28 DIAGNOSIS — N529 Male erectile dysfunction, unspecified: Secondary | ICD-10-CM | POA: Insufficient documentation

## 2016-05-28 DIAGNOSIS — N5089 Other specified disorders of the male genital organs: Secondary | ICD-10-CM

## 2016-05-28 DIAGNOSIS — Z79899 Other long term (current) drug therapy: Secondary | ICD-10-CM | POA: Insufficient documentation

## 2016-05-28 DIAGNOSIS — R7303 Prediabetes: Secondary | ICD-10-CM | POA: Insufficient documentation

## 2016-05-28 DIAGNOSIS — F331 Major depressive disorder, recurrent, moderate: Secondary | ICD-10-CM | POA: Insufficient documentation

## 2016-05-28 DIAGNOSIS — F3341 Major depressive disorder, recurrent, in partial remission: Secondary | ICD-10-CM | POA: Insufficient documentation

## 2016-05-28 DIAGNOSIS — R350 Frequency of micturition: Secondary | ICD-10-CM | POA: Diagnosis not present

## 2016-05-28 DIAGNOSIS — E785 Hyperlipidemia, unspecified: Secondary | ICD-10-CM | POA: Insufficient documentation

## 2016-05-28 LAB — CBC WITH DIFFERENTIAL/PLATELET
BASOS ABS: 79 {cells}/uL (ref 0–200)
Basophils Relative: 1 %
EOS ABS: 474 {cells}/uL (ref 15–500)
Eosinophils Relative: 6 %
HCT: 46.6 % (ref 38.5–50.0)
Hemoglobin: 16.3 g/dL (ref 13.2–17.1)
LYMPHS PCT: 22 %
Lymphs Abs: 1738 cells/uL (ref 850–3900)
MCH: 29.3 pg (ref 27.0–33.0)
MCHC: 35 g/dL (ref 32.0–36.0)
MCV: 83.8 fL (ref 80.0–100.0)
MONOS PCT: 10 %
MPV: 9.5 fL (ref 7.5–12.5)
Monocytes Absolute: 790 cells/uL (ref 200–950)
NEUTROS PCT: 61 %
Neutro Abs: 4819 cells/uL (ref 1500–7800)
PLATELETS: 290 10*3/uL (ref 140–400)
RBC: 5.56 MIL/uL (ref 4.20–5.80)
RDW: 13.4 % (ref 11.0–15.0)
WBC: 7.9 10*3/uL (ref 3.8–10.8)

## 2016-05-28 MED ORDER — BUPROPION HCL ER (XL) 150 MG PO TB24: 150.0000 mg | ORAL_TABLET | ORAL | 2 refills | Status: DC

## 2016-05-28 MED ORDER — SULFAMETHOXAZOLE-TRIMETHOPRIM 800-160 MG PO TABS: 1.0000 | ORAL_TABLET | Freq: Two times a day (BID) | ORAL | 0 refills | Status: AC

## 2016-05-28 NOTE — Progress Notes (Signed)
   Subjective:    Patient ID: Bernard Thompson, male    DOB: 05-26-1971, 45 y.o.   MRN: [id number]  HPI 45 y.o. WM presents with testicular pain x 1 month, has gotten better but will occ get worse with "stress", not worse with coughing, intercourse/ejaculation. No joint pain, no rashes. He has been depressed, wanting to eat more, increase stress.  Has had decrease in energy x 1 month, has not had fever or chills until recently x 3 days, has had chest congestion,sinus, body aces. No sick contacts. Patient also had "white stool", one episode, normal since then, no nausea, vomiting, weight loss, diarrhea, constipation, blood in stool.   Blood pressure 118/64, pulse 96, temperature 97.5 F (36.4 C), resp. rate 16, height 6\' 1"  (1.854 m), weight 280 lb 3.2 oz (127.1 kg), SpO2 96 %.  Medications Current Outpatient Prescriptions on File Prior to Visit  Medication Sig  . ALPRAZolam (XANAX) 0.5 MG tablet Take 1 tablet (0.5 mg total) by mouth 2 (two) times daily as needed for anxiety.  . citalopram (CELEXA) 40 MG tablet Take 1 tablet (40 mg total) by mouth daily.  Marland Kitchen. olmesartan (BENICAR) 40 MG tablet Take 1 tablet (40 mg total) by mouth daily.  . pravastatin (PRAVACHOL) 40 MG tablet Take 1 tablet (40 mg total) by mouth daily.   No current facility-administered medications on file prior to visit.     Problem list He has Essential hypertension; Prediabetes; Hyperlipidemia; Medication management; Hypogonadism in male; Vitamin D deficiency; Erectile dysfunction; and Depression, uncontrolled on his problem list.  Review of Systems See above    Objective:   Physical Exam  Constitutional: He appears well-developed and well-nourished.  HENT:  Head: Normocephalic and atraumatic.  Right Ear: External ear normal.  Left Ear: External ear normal.  Nose: Right sinus exhibits maxillary sinus tenderness. Left sinus exhibits maxillary sinus tenderness.  Mouth/Throat: Oropharynx is clear and moist.  Eyes:  Conjunctivae are normal. Pupils are equal, round, and reactive to light.  Neck: Normal range of motion. Neck supple.  Cardiovascular: Normal rate, regular rhythm and normal heart sounds.   Pulmonary/Chest: Effort normal and breath sounds normal. He has no wheezes.  Abdominal: Soft. Bowel sounds are normal. There is no tenderness. Hernia confirmed negative in the right inguinal area.  Genitourinary: Penis normal. Right testis shows swelling. Right testis shows no mass and no tenderness. Right testis is descended. Left testis shows no mass, no swelling and no tenderness. Left testis is descended. Cremasteric reflex is not absent on the left side. Uncircumcised. No penile erythema. No discharge found.  Musculoskeletal: Normal range of motion.  Lymphadenopathy:    He has no cervical adenopathy.       Right: No inguinal adenopathy present.  Neurological: No cranial nerve deficit.  Skin: Skin is warm and dry.      Assessment & Plan:  1. Fatigue, unspecified type ? From depression/anxiety- add on wellbutrin, check labs, follow up 1 month Discussed serotonin syndrome Any new symptoms call the office - CBC with Differential/Platelet - Comprehensive metabolic panel - Testosterone  2. Testicular swelling, right ? Possible hydrocele, no evidence of hernia and no masses, ice, loose fitting clothes, check labs, if not better urologist/US  3. Urinary frequency Bactrim, possible prostate infection - Urinalysis, Routine w reflex microscopic - Urine culture

## 2016-05-28 NOTE — Patient Instructions (Addendum)
Add wellbutrin 150, see about serotonin syndrome below Get on bactrim, follow up one month if testicular swelling is not better will refer to urology If any worsening pain, fever, chills. AB pain, trouble with urinating go to ER   Scrotal Swelling Scrotal swelling may occur on one or both sides of the scrotum. Pain may also occur with swelling. Possible causes of scrotal swelling include:  Injury.  Infection.  An ingrown hair or abrasion in the area.  Repeated rubbing from tight-fitting underwear.  Poor hygiene.  A weakened area in the muscles around the groin (hernia). A hernia can allow abdominal contents to push into the scrotum.  Fluid around the testicle (hydrocele).  Enlarged vein around the testicle (varicocele).  Certain medical treatments or existing conditions.  A recent genital surgery or procedure.  The spermatic cord becomes twisted in the scrotum, which cuts off blood supply (testicular torsion).  Testicular cancer. Follow these instructions at home: Once the cause of your scrotal swelling has been determined, you may be asked to monitor your scrotum for any changes. The following actions may help to alleviate any discomfort you are experiencing:  Rest and limit activity until the swelling goes away. Lying down is the preferred position.  Put ice on the scrotum:  Put ice in a plastic bag.  Place a towel between your skin and the bag.  Leave the ice on for 20 minutes, 2-3 times a day for 1-2 days.  Place a rolled towel under the testicles for support.  Wear loose-fitting clothing or an athletic support cup for comfort.  Take all medicines as directed by your health care provider.  Perform a monthly self-exam of the scrotum and penis. Feel for changes. Ask your health care provider how to perform a monthly self-exam if you are unsure. Contact a health care provider if:  You have a sudden (acute) onset of pain that is persistent and not  improving.  You notice a heavy feeling or fluid in the scrotum.  You have pain or burning while urinating.  You have blood in the urine or semen.  You feel a lump around the testicle.  You notice that one testicle is larger than the other (slight variation is normal).  You have a persistent dull ache or pain in the groin or scrotum. Get help right away if:  The pain does not go away or becomes severe.  You have a fever or shaking chills.  You have pain or vomiting that cannot be controlled.  You notice significant redness or swelling of one or both sides of the scrotum.  You experience redness spreading upward from your scrotum to your abdomen or downward from your scrotum to your thighs. This information is not intended to replace advice given to you by your health care provider. Make sure you discuss any questions you have with your health care provider. Document Released: 06/29/2010 Document Revised: 12/15/2015 Document Reviewed: 10/29/2012 Elsevier Interactive Patient Education  2017 [employer]  Serotonin Syndrome Serotonin is a brain chemical that regulates the nervous system, which includes the brain, spinal cord, and nerves. Serotonin appears to play a role in all types of behavior, including appetite, emotions, movement, thinking, and response to stress. Excessively high levels of serotonin in the body can cause serotonin syndrome, which is a very dangerous condition. What are the causes? This condition can be caused by taking medicines or drugs that increase the level of serotonin in your body. These include:  Antidepressant medicines.  Migraine medicines.  Certain pain medicines.  Certain recreational drugs, including ecstasy, LSD, cocaine, and amphetamines.  Over-the-counter cough or cold medicines that contain dextromethorphan.  Certain herbal supplements, including St. John's wort, ginseng, and nutmeg. This condition usually occurs when you take these  medicines or drugs in combination, but it can also happen with a high dose of a single medicine or drug. What increases the risk? This condition is more likely to develop in:  People who have recently increased the dosage of medicine that increases the serotonin level.  People who just started taking medicine that increases the serotonin level. What are the signs or symptoms? Symptoms of this condition usually happens within several hours of a medicine change. Symptoms include:  Headache.  Muscle twitching or stiffness.  Diarrhea.  Confusion.  Restlessness or agitation.  Shivering or goose bumps.  Loss of muscle coordination.  Rapid heart rate.  Sweating. Severe cases of serotonin syndromecan cause:  Irregular heartbeat.  Seizures.  Loss of consciousness.  High fever. How is this diagnosed? This condition is diagnosed with a medical history and physical exam. You will be asked aboutyour symptoms and your use of medicines and recreational drugs. Your health care provider may also order lab work or additional tests to rule out other causes of your symptoms. How is this treated? The treatment for this condition depends on the severity of your symptoms. For mild cases, stopping the medicine that caused your condition is usually all that is needed. For moderate to severe cases, hospitalization is required to monitor you and to prevent further muscle damage. Follow these instructions at home:  Take over-the-counter and prescription medicines only as told by your health care provider. This is important.  Check with your health care provider before you start taking any new prescriptions, over-the-counter medicines, herbs, or supplements.  Avoid combining any medicines that can cause this condition to occur.  Keep all follow-up visits as told by your health care provider.This is important.  Maintain a healthy lifestyle.  Eat healthy foods.  Get plenty of  sleep.  Exercise regularly.  Do not drink alcohol.  Do not use recreational drugs. Contact a health care provider if:  Medicines do not seem to be helping.  Your symptoms do not improve or they get worse.  You have trouble taking care of yourself. Get help right away if:  You have worsening confusion, severe headache, chest pain, high fever, seizures, or loss of consciousness.  You have serious thoughts about hurting yourself or others.  You experience serious side effects of medicine, such as swelling of your face, lips, tongue, or throat. This information is not intended to replace advice given to you by your health care provider. Make sure you discuss any questions you have with your health care provider. Document Released: 07/04/2004 Document Revised: 01/20/2016 Document Reviewed: 06/09/2014 Elsevier Interactive Patient Education  2017 [employer]

## 2016-05-29 LAB — COMPREHENSIVE METABOLIC PANEL
ALT: 32 U/L (ref 9–46)
AST: 24 U/L (ref 10–40)
Albumin: 4.6 g/dL (ref 3.6–5.1)
Alkaline Phosphatase: 84 U/L (ref 40–115)
BUN: 13 mg/dL (ref 7–25)
CALCIUM: 9.4 mg/dL (ref 8.6–10.3)
CHLORIDE: 104 mmol/L (ref 98–110)
CO2: 25 mmol/L (ref 20–31)
Creat: 1.38 mg/dL — ABNORMAL HIGH (ref 0.60–1.35)
GLUCOSE: 93 mg/dL (ref 65–99)
POTASSIUM: 4.6 mmol/L (ref 3.5–5.3)
Sodium: 138 mmol/L (ref 135–146)
TOTAL PROTEIN: 7.2 g/dL (ref 6.1–8.1)
Total Bilirubin: 0.9 mg/dL (ref 0.2–1.2)

## 2016-05-29 LAB — URINALYSIS, ROUTINE W REFLEX MICROSCOPIC
BILIRUBIN URINE: NEGATIVE
GLUCOSE, UA: NEGATIVE
HGB URINE DIPSTICK: NEGATIVE
Ketones, ur: NEGATIVE
Leukocytes, UA: NEGATIVE
Nitrite: NEGATIVE
PH: 6 (ref 5.0–8.0)
PROTEIN: NEGATIVE
Specific Gravity, Urine: 1.017 (ref 1.001–1.035)

## 2016-05-29 LAB — TESTOSTERONE: Testosterone: 191 ng/dL — ABNORMAL LOW (ref 250–827)

## 2016-05-30 LAB — URINE CULTURE: Organism ID, Bacteria: NO GROWTH

## 2016-06-05 ENCOUNTER — Other Ambulatory Visit: Payer: Self-pay

## 2016-06-05 MED ORDER — OLMESARTAN MEDOXOMIL 40 MG PO TABS: 40.0000 mg | ORAL_TABLET | Freq: Every day | ORAL | 0 refills | Status: DC

## 2016-06-25 ENCOUNTER — Ambulatory Visit (INDEPENDENT_AMBULATORY_CARE_PROVIDER_SITE_OTHER): Payer: 59 | Admitting: Physician Assistant

## 2016-06-25 ENCOUNTER — Encounter: Payer: Self-pay | Admitting: Physician Assistant

## 2016-06-25 VITALS — BP 110/74 | HR 76 | Temp 97.2°F | Ht 73.0 in | Wt 282.0 lb

## 2016-06-25 DIAGNOSIS — F329 Major depressive disorder, single episode, unspecified: Secondary | ICD-10-CM

## 2016-06-25 DIAGNOSIS — R5383 Other fatigue: Secondary | ICD-10-CM | POA: Diagnosis not present

## 2016-06-25 DIAGNOSIS — F32A Depression, unspecified: Secondary | ICD-10-CM

## 2016-06-25 DIAGNOSIS — E291 Testicular hypofunction: Secondary | ICD-10-CM | POA: Diagnosis not present

## 2016-06-25 DIAGNOSIS — N5089 Other specified disorders of the male genital organs: Secondary | ICD-10-CM

## 2016-06-25 MED ORDER — TESTOSTERONE 5.5 MG/ACT NA GEL: 5.5000 mg | Freq: Three times a day (TID) | NASAL | 3 refills | Status: DC

## 2016-06-25 NOTE — Patient Instructions (Addendum)
Will refer to urologist and get ultasound Try the nasal gel 2-3 times a day read instructions, see if this helps mood Continue wellbutrin, can increase to twice a day Can switch to testosterone shots if need to Add whey protein with almond milk with wake up Add zinc  9 Ways to Naturally Increase Testosterone Levels  1.   Lose Weight If you're overweight, shedding the excess pounds may increase your testosterone levels, according to research presented at the Endocrine Society's 2012 meeting. Overweight men are more likely to have low testosterone levels to begin with, so this is an important trick to increase your body's testosterone production when you need it most.  2.   High-Intensity Exercise like Peak Fitness  Short intense exercise has a proven positive effect on increasing testosterone levels and preventing its decline. That's unlike aerobics or prolonged moderate exercise, which have shown to have negative or no effect on testosterone levels. Having a whey protein meal after exercise can further enhance the satiety/testosterone-boosting impact (hunger hormones cause the opposite effect on your testosterone and libido). Here's a summary of what a typical high-intensity Peak Fitness routine might look like: " Warm up for three minutes  " Exercise as hard and fast as you can for 30 seconds. You should feel like you couldn't possibly go on another few seconds  " Recover at a slow to moderate pace for 90 seconds  " Repeat the high intensity exercise and recovery 7 more times .  3.   Consume Plenty of Zinc The mineral zinc is important for testosterone production, and supplementing your diet for as little as six weeks has been shown to cause a marked improvement in testosterone among men with low levels.1 Likewise, research has shown that restricting dietary sources of zinc leads to a significant decrease in testosterone, while zinc supplementation increases it2 -- and even protects men from  exercised-induced reductions in testosterone levels.3 It's estimated that up to 45 percent of adults over the age of 60 may have lower than recommended zinc intakes; even when dietary supplements were added in, an estimated 20-25 percent of older adults still had inadequate zinc intakes, according to a [employer] and Nutrition Examination Survey.4 Your diet is the best source of zinc; along with protein-rich foods like meats and fish, other good dietary sources of zinc include raw milk, raw cheese, beans, and yogurt or kefir made from raw milk. It can be difficult to obtain enough dietary zinc if you're a vegetarian, and also for meat-eaters as well, largely because of conventional farming methods that rely heavily on chemical fertilizers and pesticides. These chemicals deplete the soil of nutrients ... nutrients like zinc that must be absorbed by plants in order to be passed on to you. In many cases, you may further deplete the nutrients in your food by the way you prepare it. For most food, cooking it will drastically reduce its levels of nutrients like zinc . particularly over-cooking, which many people do. If you decide to use a zinc supplement, stick to a dosage of less than 40 mg a day, as this is the recommended adult upper limit. Taking too much zinc can interfere with your body's ability to absorb other minerals, especially copper, and may cause nausea as a side effect.  4.   Strength Training In addition to Peak Fitness, strength training is also known to boost testosterone levels, provided you are doing so intensely enough. When strength training to boost testosterone, you'll want to increase the  weight and lower your number of reps, and then focus on exercises that work a large number of muscles, such as dead lifts or squats.  You can "turbo-charge" your weight training by going slower. By slowing down your movement, you're actually turning it into a high-intensity exercise. Super Slow  movement allows your muscle, at the microscopic level, to access the maximum number of cross-bridges between the protein filaments that produce movement in the muscle.   5.   Optimize Your Vitamin D Levels Vitamin D, a steroid hormone, is essential for the healthy development of the nucleus of the sperm cell, and helps maintain semen quality and sperm count. Vitamin D also increases levels of testosterone, which may boost libido. In one study, overweight men who were given vitamin D supplements had a significant increase in testosterone levels after one year.5   6.   Reduce Stress When you're under a lot of stress, your body releases high levels of the stress hormone cortisol. This hormone actually blocks the effects of testosterone,6 presumably because, from a biological standpoint, testosterone-associated behaviors (mating, competing, aggression) may have lowered your chances of survival in an emergency (hence, the "fight or flight" response is dominant, courtesy of cortisol).  7.   Limit or Eliminate Sugar from Your Diet Testosterone levels decrease after you eat sugar, which is likely because the sugar leads to a high insulin level, another factor leading to low testosterone.7 Based on USDA estimates, the average American consumes 12 teaspoons of sugar a day, which equates to about TWO TONS of sugar during a lifetime.  8.   Eat Healthy Fats By healthy, this means not only mon- and polyunsaturated fats, like that found in avocadoes and nuts, but also saturated, as these are essential for building testosterone. Research shows that a diet with less than 40 percent of energy as fat (and that mainly from animal sources, i.e. saturated) lead to a decrease in testosterone levels.8 My personal diet is about 60-70 percent healthy fat, and other experts agree that the ideal diet includes somewhere between 50-70 percent fat.  It's important to understand that your body requires saturated fats from animal and  vegetable sources (such as meat, dairy, certain oils, and tropical plants like coconut) for optimal functioning, and if you neglect this important food group in favor of sugar, grains and other starchy carbs, your health and weight are almost guaranteed to suffer. Examples of healthy fats you can eat more of to give your testosterone levels a boost include: Olives and Olive oil  Coconuts and coconut oil Butter made from raw grass-fed organic milk Raw nuts, such as, almonds or pecans Organic pastured egg yolks Avocados Grass-fed meats Palm oil Unheated organic nut oils   9.   Boost Your Intake of Branch Chain Amino Acids (BCAA) from Foods Like Whey Protein Research suggests that BCAAs result in higher testosterone levels, particularly when taken along with resistance training.9 While BCAAs are available in supplement form, you'll find the highest concentrations of BCAAs like leucine in dairy products - especially quality cheeses and whey protein. Even when getting leucine from your natural food supply, it's often wasted or used as a building block instead of an anabolic agent. So to create the correct anabolic environment, you need to boost leucine consumption way beyond mere maintenance levels. That said, keep in mind that using leucine as a free form amino acid can be highly counterproductive as when free form amino acids are artificially administrated, they rapidly enter your circulation while  disrupting insulin function, and impairing your body's glycemic control. Food-based leucine is really the ideal form that can benefit your muscles without side effects.

## 2016-06-25 NOTE — Progress Notes (Signed)
Assessment and Plan: 1. Testicular swelling, right - Ambulatory referral to Urology - US Scrotum; Future  2. Fatigue, unspecified type Will try testosterone to help with symptoms, if not will double up on wellbutrin - Testosterone (NATESTO) 5.5 MG/ACT GEL; Place 5.5 mg into the nose 3 (three) times daily.  Dispense: 3 Tube; Refill: 3  3. Hypogonadism in male - Testosterone (NATESTO) 5.5 MG/ACT GEL; Place 5.5 mg into the nose 3 (three) times daily.  Dispense: 3 Tube; Refill: 3  4. Depression, uncontrolled Will try testosterone to help with symptoms, if not will double up on wellbutrin  Future Appointments Date Time Provider Department Center  09/23/2016 2:30 PM Courtney Forcucci, PA-C [id number] None     HPI 45 y.o.male presents for 1 month follow up. Last visit he was started on wellbutrin for depression and bactrim for possible prostate infection. Was put on bactrim without help of right testicular pain, will get ultrasound and refer to urologist.   Past Medical History:  Diagnosis Date  . Anxiety   . Depression   . HLD (hyperlipidemia)   . HTN (hypertension)      No Known Allergies  Current Outpatient Prescriptions on File Prior to Visit  Medication Sig  . ALPRAZolam (XANAX) 0.5 MG tablet Take 1 tablet (0.5 mg total) by mouth 2 (two) times daily as needed for anxiety.  Marland Kitchen. buPROPion (WELLBUTRIN XL) 150 MG 24 hr tablet Take 1 tablet (150 mg total) by mouth every morning.  . citalopram (CELEXA) 40 MG tablet Take 1 tablet (40 mg total) by mouth daily.  Marland Kitchen. olmesartan (BENICAR) 40 MG tablet Take 1 tablet (40 mg total) by mouth daily.  . pravastatin (PRAVACHOL) 40 MG tablet Take 1 tablet (40 mg total) by mouth daily.   No current facility-administered medications on file prior to visit.     ROS: all negative except above.   Physical Exam: There were no vitals filed for this visit. There were no vitals taken for this visit. General Appearance: Well nourished, in no apparent  distress. Eyes: PERRLA, EOMs, conjunctiva no swelling or erythema Sinuses: No Frontal/maxillary tenderness ENT/Mouth: Ext aud canals clear, TMs without erythema, bulging. No erythema, swelling, or exudate on post pharynx.  Tonsils not swollen or erythematous. Hearing normal.  Neck: Supple, thyroid normal.  Respiratory: Respiratory effort normal, BS equal bilaterally without rales, rhonchi, wheezing or stridor.  Cardio: RRR with no MRGs. Brisk peripheral pulses without edema.  Abdomen: Soft, + BS.  Non tender, no guarding, rebound, hernias, masses. Lymphatics: Non tender without lymphadenopathy.  Musculoskeletal: Full ROM, 5/5 strength, normal gait.  Skin: Warm, dry without rashes, lesions, ecchymosis.  Neuro: Cranial nerves intact. Normal muscle tone, no cerebellar symptoms. Sensation intact.  Psych: Awake and oriented X 3, normal affect, Insight and Judgment appropriate.     Quentin MullingAmanda Jamarl Pew, PA-C 11:11 AM [facility]

## 2016-07-01 ENCOUNTER — Other Ambulatory Visit: Payer: Self-pay | Admitting: Physician Assistant

## 2016-07-01 DIAGNOSIS — N5089 Other specified disorders of the male genital organs: Secondary | ICD-10-CM

## 2016-07-04 ENCOUNTER — Encounter: Payer: Self-pay | Admitting: Physician Assistant

## 2016-07-08 ENCOUNTER — Other Ambulatory Visit: Payer: Self-pay | Admitting: Physician Assistant

## 2016-07-08 DIAGNOSIS — N5089 Other specified disorders of the male genital organs: Secondary | ICD-10-CM

## 2016-07-16 ENCOUNTER — Ambulatory Visit
Admission: RE | Admit: 2016-07-16 | Discharge: 2016-07-16 | Disposition: A | Discharge status: Home / Self Care | Payer: 59 | Source: Ambulatory Visit | Attending: Physician Assistant | Admitting: Physician Assistant

## 2016-07-16 DIAGNOSIS — N5089 Other specified disorders of the male genital organs: Secondary | ICD-10-CM

## 2016-08-05 DIAGNOSIS — Z125 Encounter for screening for malignant neoplasm of prostate: Secondary | ICD-10-CM | POA: Diagnosis not present

## 2016-08-05 DIAGNOSIS — E291 Testicular hypofunction: Secondary | ICD-10-CM | POA: Diagnosis not present

## 2016-08-05 DIAGNOSIS — N430 Encysted hydrocele: Secondary | ICD-10-CM | POA: Diagnosis not present

## 2016-08-05 DIAGNOSIS — R350 Frequency of micturition: Secondary | ICD-10-CM | POA: Diagnosis not present

## 2016-09-05 ENCOUNTER — Other Ambulatory Visit: Payer: Self-pay

## 2016-09-05 MED ORDER — OLMESARTAN MEDOXOMIL 40 MG PO TABS: 40.0000 mg | ORAL_TABLET | Freq: Every day | ORAL | 0 refills | Status: DC

## 2016-09-11 ENCOUNTER — Other Ambulatory Visit: Payer: Self-pay | Admitting: *Deleted

## 2016-09-11 ENCOUNTER — Other Ambulatory Visit: Payer: Self-pay | Admitting: Internal Medicine

## 2016-09-11 MED ORDER — PRAVASTATIN SODIUM 40 MG PO TABS: 40.0000 mg | ORAL_TABLET | Freq: Every day | ORAL | 0 refills | Status: DC

## 2016-09-23 ENCOUNTER — Ambulatory Visit: Payer: Self-pay | Admitting: Internal Medicine

## 2016-10-07 NOTE — Progress Notes (Signed)
Assessment and Plan:  Hypertension:  -Continue medication,  -monitor blood pressure at home.  -Continue DASH diet.   -Reminder to go to the ER if any CP, SOB, nausea, dizziness, severe HA, changes vision/speech, left arm numbness and tingling, and jaw pain.  Cholesterol: -Continue diet and exercise.  -Check cholesterol.   Vitamin D Def: -check level -continue medications.   Anxiety and Depression -xanax refilled  Hypogonadism - continue replacement therapy, will try testim, check testosterone levels as needed.   Morbid Obesity with co morbidities - long discussion about weight loss, diet, and exercise  Continue diet and meds as discussed. Further disposition pending results of labs. No future appointments.  HPI 46 y.o. male  presents for 3 month follow up with hypertension, hyperlipidemia, prediabetes and vitamin D.   His blood pressure has been controlled at home, today their BP is BP: 116/60.   He does not workout. He denies chest pain, shortness of breath, dizziness.   Wife was let go today from work.    He is on cholesterol medication and denies myalgias. His cholesterol is at goal. The cholesterol last visit was:   Lab Results  Component Value Date   CHOL 164 03/05/2016   HDL 39 (L) 03/05/2016   LDLCALC 96 03/05/2016   TRIG 147 03/05/2016   CHOLHDL 4.2 03/05/2016     He has been working on diet and exercise for prediabetes, and denies foot ulcerations, hyperglycemia, hypoglycemia , increased appetite, nausea, paresthesia of the feet, polydipsia, polyuria, visual disturbances, vomiting and weight loss. Last A1C in the office was:  Lab Results  Component Value Date   HGBA1C 5.3 03/05/2016   Patient is on Vitamin D supplement.  Lab Results  Component Value Date   VD25OH 26 (L) 05/30/2015     He reports that he has some issues with anxiety.  He reports that he rarely uses the xanax.  He is out of the xanax at this time, on the wellbutrin and celexa, last refill  was sept.   BMI is Body mass index is 37.26 kg/m., he is working on diet and exercise. Wt Readings from Last 3 Encounters:  10/08/16 282 lb 6.4 oz (128.1 kg)  06/25/16 282 lb (127.9 kg)  05/28/16 280 lb 3.2 oz (127.1 kg)   He has a history of testosterone deficiency and is on testosterone replacement,  He states that the testosterone helps with his energy, libido, muscle mass. Lab Results  Component Value Date   TESTOSTERONE 191 (L) 05/28/2016    Current Medications:  Current Outpatient Prescriptions on File Prior to Visit  Medication Sig Dispense Refill  . ALPRAZolam (XANAX) 0.5 MG tablet Take 1 tablet (0.5 mg total) by mouth 2 (two) times daily as needed for anxiety. 60 tablet 0  . buPROPion (WELLBUTRIN XL) 150 MG 24 hr tablet Take 1 tablet (150 mg total) by mouth every morning. 30 tablet 2  . citalopram (CELEXA) 40 MG tablet Take 1 tablet (40 mg total) by mouth daily. 90 tablet 2  . olmesartan (BENICAR) 40 MG tablet Take 1 tablet (40 mg total) by mouth daily. 90 tablet 0  . pravastatin (PRAVACHOL) 40 MG tablet Take 1 tablet (40 mg total) by mouth daily. 90 tablet 0  . Testosterone (NATESTO) 5.5 MG/ACT GEL Place 5.5 mg into the nose 3 (three) times daily. 3 Tube 3   No current facility-administered medications on file prior to visit.     Medical History:  Past Medical History:  Diagnosis Date  .  Anxiety   . Depression   . HLD (hyperlipidemia)   . HTN (hypertension)     Allergies: No Known Allergies   Review of Systems:  Review of Systems  Constitutional: Negative for chills, fever and malaise/fatigue.  HENT: Negative for congestion, ear pain and sore throat.   Eyes: Negative.   Respiratory: Negative for cough, shortness of breath and wheezing.   Cardiovascular: Negative for chest pain, palpitations and leg swelling.  Gastrointestinal: Negative for abdominal pain, blood in stool, constipation, diarrhea, heartburn and melena.  Genitourinary: Negative.   Skin: Negative.    Neurological: Negative for dizziness, sensory change, loss of consciousness and headaches.  Psychiatric/Behavioral: Negative for depression. The patient is not nervous/anxious and does not have insomnia.     Family history- Review and unchanged  Social history- Review and unchanged  Physical Exam: BP 116/60   Pulse 75   Temp 97.5 F (36.4 C)   Resp 14   Ht  (1.854 m)   Wt 282 lb 6.4 oz (128.1 kg)   SpO2 96%   BMI 37.26 kg/m  Wt Readings from Last 3 Encounters:  10/08/16 282 lb 6.4 oz (128.1 kg)  06/25/16 282 lb (127.9 kg)  05/28/16 280 lb 3.2 oz (127.1 kg)    General Appearance: Well nourished well developed, in no apparent distress. Eyes: PERRLA, EOMs, conjunctiva no swelling or erythema ENT/Mouth: Ear canals normal without obstruction, swelling, erythma, discharge.  TMs normal bilaterally.  Oropharynx moist, clear, without exudate, or postoropharyngeal swelling. Neck: Supple, thyroid normal,no cervical adenopathy  Respiratory: Respiratory effort normal, Breath sounds clear A&P without rhonchi, wheeze, or rale.  No retractions, no accessory usage. Cardio: RRR with no MRGs. Brisk peripheral pulses without edema.  Abdomen: Soft, + BS,  Non tender, no guarding, rebound, hernias, masses. Musculoskeletal: Full ROM, 5/5 strength, Normal gait Skin: Warm, dry without rashes, lesions, ecchymosis.  Neuro: Awake and oriented X 3, Cranial nerves intact. Normal muscle tone, no cerebellar symptoms. Psych: Normal affect, Insight and Judgment appropriate.    [name], PA-C 4:06 PM [facility]

## 2016-10-08 ENCOUNTER — Ambulatory Visit (INDEPENDENT_AMBULATORY_CARE_PROVIDER_SITE_OTHER): Payer: 59 | Admitting: Physician Assistant

## 2016-10-08 ENCOUNTER — Encounter: Payer: Self-pay | Admitting: Physician Assistant

## 2016-10-08 VITALS — BP 116/60 | HR 75 | Temp 97.5°F | Resp 14 | Ht 73.0 in | Wt 282.4 lb

## 2016-10-08 DIAGNOSIS — E559 Vitamin D deficiency, unspecified: Secondary | ICD-10-CM | POA: Diagnosis not present

## 2016-10-08 DIAGNOSIS — E291 Testicular hypofunction: Secondary | ICD-10-CM

## 2016-10-08 DIAGNOSIS — E785 Hyperlipidemia, unspecified: Secondary | ICD-10-CM | POA: Diagnosis not present

## 2016-10-08 DIAGNOSIS — Z79899 Other long term (current) drug therapy: Secondary | ICD-10-CM | POA: Diagnosis not present

## 2016-10-08 DIAGNOSIS — I10 Essential (primary) hypertension: Secondary | ICD-10-CM

## 2016-10-08 DIAGNOSIS — R7303 Prediabetes: Secondary | ICD-10-CM | POA: Diagnosis not present

## 2016-10-08 LAB — CBC WITH DIFFERENTIAL/PLATELET
BASOS ABS: 82 {cells}/uL (ref 0–200)
Basophils Relative: 1 %
EOS ABS: 328 {cells}/uL (ref 15–500)
EOS PCT: 4 %
HEMATOCRIT: 43.7 % (ref 38.5–50.0)
HEMOGLOBIN: 15.4 g/dL (ref 13.2–17.1)
LYMPHS ABS: 2050 {cells}/uL (ref 850–3900)
Lymphocytes Relative: 25 %
MCH: 29.7 pg (ref 27.0–33.0)
MCHC: 35.2 g/dL (ref 32.0–36.0)
MCV: 84.2 fL (ref 80.0–100.0)
MONO ABS: 656 {cells}/uL (ref 200–950)
MPV: 9.3 fL (ref 7.5–12.5)
Monocytes Relative: 8 %
NEUTROS ABS: 5084 {cells}/uL (ref 1500–7800)
NEUTROS PCT: 62 %
PLATELETS: 239 10*3/uL (ref 140–400)
RBC: 5.19 MIL/uL (ref 4.20–5.80)
RDW: 13.2 % (ref 11.0–15.0)
WBC: 8.2 10*3/uL (ref 3.8–10.8)

## 2016-10-08 MED ORDER — TESTOSTERONE 50 MG/5GM (1%) TD GEL: 5.0000 g | Freq: Every day | TRANSDERMAL | 3 refills | Status: DC

## 2016-10-08 MED ORDER — ALPRAZOLAM 0.5 MG PO TABS
0.5000 mg | ORAL_TABLET | Freq: Two times a day (BID) | ORAL | 0 refills | Status: DC | PRN
Start: 2016-10-08 — End: 2017-03-31

## 2016-10-08 NOTE — Patient Instructions (Addendum)
Vitamin D goal is between 60-80  Please make sure that you are taking your Vitamin D as directed.   It is very important as a natural anti-inflammatory   helping hair, skin, and nails, as well as reducing stroke and heart attack risk.   It helps your bones and helps with mood.  We want you on at least 5000 IU daily  It also decreases numerous cancer risks so please take it as directed.   Low Vit D is associated with a 200-300% higher risk for CANCER   and 200-300% higher risk for HEART   ATTACK  &  STROKE.    .....................................Marland Kitchen  It is also associated with higher death rate at younger ages,   autoimmune diseases like Rheumatoid arthritis, Lupus, Multiple Sclerosis.     Also many other serious conditions, like depression, Alzheimer's  Dementia, infertility, muscle aches, fatigue, fibromyalgia - just to name a few.  +++++++++++++++++++  Can get liquid vitamin D from [place]  OR here in [place] at  [facility] alternatives [address], [place], [place] [address] Or you can try earth fare     Simple math prevails.    1st - exercise does not produce significant weight loss - at best one converts fat into muscle , "bulks up", loses inches, but usually stays "weight neutral"     2nd - think of your body weightas a check book: If you eat more calories than you burn up - you save money or gain weight .... Or if you spend more money than you put in the check book, ie burn up more calories than you eat, then you lose weight     3rd - if you walk or run 1 mile, you burn up 100 calories - you have to burn up 3,500 calories to lose 1 pound, ie you have to walk/run 35 miles to lose 1 measly pound. So if you want to lose 10 #, then you have to walk/run 350 miles, so.... clearly exercise is not the solution.     4. So if you consume 1,500 calories, then you have to burn up the equivalent of 15 miles to stay weight neutral - It also stands to reason that if you consume  1,500 cal/day and don't lose weight, then you must be burning up about 1,500 cals/day to stay weight neutral.     5. If you really want to lose weight, you must cut your calorie intake 300 calories /day and at that rate you should lose about 1 # every 3 days.   6. Please purchase Dr Francis Dowse Fuhrman's book(s) "The End of Dieting" & "Eat to Live" . It has some great concepts and recipes.     9 Ways to Naturally Increase Testosterone Levels  1.   Lose Weight If you're overweight, shedding the excess pounds may increase your testosterone levels, according to research presented at the Endocrine Society's 2012 meeting. Overweight men are more likely to have low testosterone levels to begin with, so this is an important trick to increase your body's testosterone production when you need it most.  2.   High-Intensity Exercise like Peak Fitness  Short intense exercise has a proven positive effect on increasing testosterone levels and preventing its decline. That's unlike aerobics or prolonged moderate exercise, which have shown to have negative or no effect on testosterone levels. Having a whey protein meal after exercise can further enhance the satiety/testosterone-boosting impact (hunger hormones cause the opposite effect on your testosterone and libido). Here's  a summary of what a typical high-intensity Peak Fitness routine might look like: " Warm up for three minutes  " Exercise as hard and fast as you can for 30 seconds. You should feel like you couldn't possibly go on another few seconds  " Recover at a slow to moderate pace for 90 seconds  " Repeat the high intensity exercise and recovery 7 more times .  3.   Consume Plenty of Zinc The mineral zinc is important for testosterone production, and supplementing your diet for as little as six weeks has been shown to cause a marked improvement in testosterone among men with low levels.1 Likewise, research has shown that restricting dietary sources of zinc  leads to a significant decrease in testosterone, while zinc supplementation increases it2 -- and even protects men from exercised-induced reductions in testosterone levels.3 It's estimated that up to 45 percent of adults over the age of 60 may have lower than recommended zinc intakes; even when dietary supplements were added in, an estimated 20-25 percent of older adults still had inadequate zinc intakes, according to a [employer] and Nutrition Examination Survey.4 Your diet is the best source of zinc; along with protein-rich foods like meats and fish, other good dietary sources of zinc include raw milk, raw cheese, beans, and yogurt or kefir made from raw milk. It can be difficult to obtain enough dietary zinc if you're a vegetarian, and also for meat-eaters as well, largely because of conventional farming methods that rely heavily on chemical fertilizers and pesticides. These chemicals deplete the soil of nutrients ... nutrients like zinc that must be absorbed by plants in order to be passed on to you. In many cases, you may further deplete the nutrients in your food by the way you prepare it. For most food, cooking it will drastically reduce its levels of nutrients like zinc . particularly over-cooking, which many people do. If you decide to use a zinc supplement, stick to a dosage of less than 40 mg a day, as this is the recommended adult upper limit. Taking too much zinc can interfere with your body's ability to absorb other minerals, especially copper, and may cause nausea as a side effect.  4.   Strength Training In addition to Peak Fitness, strength training is also known to boost testosterone levels, provided you are doing so intensely enough. When strength training to boost testosterone, you'll want to increase the weight and lower your number of reps, and then focus on exercises that work a large number of muscles, such as dead lifts or squats.  You can "turbo-charge" your weight training by  going slower. By slowing down your movement, you're actually turning it into a high-intensity exercise. Super Slow movement allows your muscle, at the microscopic level, to access the maximum number of cross-bridges between the protein filaments that produce movement in the muscle.   5.   Optimize Your Vitamin D Levels Vitamin D, a steroid hormone, is essential for the healthy development of the nucleus of the sperm cell, and helps maintain semen quality and sperm count. Vitamin D also increases levels of testosterone, which may boost libido. In one study, overweight men who were given vitamin D supplements had a significant increase in testosterone levels after one year.5   6.   Reduce Stress When you're under a lot of stress, your body releases high levels of the stress hormone cortisol. This hormone actually blocks the effects of testosterone,6 presumably because, from a biological standpoint, testosterone-associated behaviors (mating,  competing, aggression) may have lowered your chances of survival in an emergency (hence, the "fight or flight" response is dominant, courtesy of cortisol).  7.   Limit or Eliminate Sugar from Your Diet Testosterone levels decrease after you eat sugar, which is likely because the sugar leads to a high insulin level, another factor leading to low testosterone.7 Based on USDA estimates, the average American consumes 12 teaspoons of sugar a day, which equates to about TWO TONS of sugar during a lifetime.  8.   Eat Healthy Fats By healthy, this means not only mon- and polyunsaturated fats, like that found in avocadoes and nuts, but also saturated, as these are essential for building testosterone. Research shows that a diet with less than 40 percent of energy as fat (and that mainly from animal sources, i.e. saturated) lead to a decrease in testosterone levels.8 My personal diet is about 60-70 percent healthy fat, and other experts agree that the ideal diet includes  somewhere between 50-70 percent fat.  It's important to understand that your body requires saturated fats from animal and vegetable sources (such as meat, dairy, certain oils, and tropical plants like coconut) for optimal functioning, and if you neglect this important food group in favor of sugar, grains and other starchy carbs, your health and weight are almost guaranteed to suffer. Examples of healthy fats you can eat more of to give your testosterone levels a boost include: Olives and Olive oil  Coconuts and coconut oil Butter made from raw grass-fed organic milk Raw nuts, such as, almonds or pecans Organic pastured egg yolks Avocados Grass-fed meats Palm oil Unheated organic nut oils   9.   Boost Your Intake of Branch Chain Amino Acids (BCAA) from Foods Like Whey Protein Research suggests that BCAAs result in higher testosterone levels, particularly when taken along with resistance training.9 While BCAAs are available in supplement form, you'll find the highest concentrations of BCAAs like leucine in dairy products - especially quality cheeses and whey protein. Even when getting leucine from your natural food supply, it's often wasted or used as a building block instead of an anabolic agent. So to create the correct anabolic environment, you need to boost leucine consumption way beyond mere maintenance levels. That said, keep in mind that using leucine as a free form amino acid can be highly counterproductive as when free form amino acids are artificially administrated, they rapidly enter your circulation while disrupting insulin function, and impairing your body's glycemic control. Food-based leucine is really the ideal form that can benefit your muscles without side effects.   Drink 120 oz of water a day Eat 3 meals a day Try protein bar More veggies.  Don't drink your calories   Simple math prevails.    1st - exercise does not produce significant weight loss - at best one converts fat into  muscle , "bulks up", loses inches, but usually stays "weight neutral"     2nd - think of your body weightas a check book: If you eat more calories than you burn up - you save money or gain weight .... Or if you spend more money than you put in the check book, ie burn up more calories than you eat, then you lose weight     3rd - if you walk or run 1 mile, you burn up 100 calories - you have to burn up 3,500 calories to lose 1 pound, ie you have to walk/run 35 miles to lose 1 measly pound. So if you  want to lose 10 #, then you have to walk/run 350 miles, so.... clearly exercise is not the solution.     4. So if you consume 1,500 calories, then you have to burn up the equivalent of 15 miles to stay weight neutral - It also stands to reason that if you consume 1,500 cal/day and don't lose weight, then you must be burning up about 1,500 cals/day to stay weight neutral.     5. If you really want to lose weight, you must cut your calorie intake 300 calories /day and at that rate you should lose about 1 # every 3 days.   6. Please purchase Dr Francis Dowse Fuhrman's book(s) "The End of Dieting" & "Eat to Live" . It has some great concepts and recipes.

## 2016-10-09 ENCOUNTER — Other Ambulatory Visit: Payer: Self-pay | Admitting: Physician Assistant

## 2016-10-09 DIAGNOSIS — N289 Disorder of kidney and ureter, unspecified: Secondary | ICD-10-CM

## 2016-10-09 LAB — BASIC METABOLIC PANEL WITH GFR
BUN: 15 mg/dL (ref 7–25)
CHLORIDE: 103 mmol/L (ref 98–110)
CO2: 24 mmol/L (ref 20–31)
CREATININE: 1.66 mg/dL — AB (ref 0.60–1.35)
Calcium: 9.3 mg/dL (ref 8.6–10.3)
GFR, EST AFRICAN AMERICAN: 56 mL/min — AB (ref >=60)
GFR, Est Non African American: 48 mL/min — ABNORMAL LOW (ref >=60)
Glucose, Bld: 92 mg/dL (ref 65–99)
Potassium: 4.8 mmol/L (ref 3.5–5.3)
SODIUM: 137 mmol/L (ref 135–146)

## 2016-10-09 LAB — LIPID PANEL
CHOL/HDL RATIO: 3.9 ratio (ref <5.0)
Cholesterol: 156 mg/dL (ref <200)
HDL: 40 mg/dL — AB (ref >40)
LDL Cholesterol: 93 mg/dL (ref <100)
TRIGLYCERIDES: 114 mg/dL (ref <150)
VLDL: 23 mg/dL (ref <30)

## 2016-10-09 LAB — HEPATIC FUNCTION PANEL
ALBUMIN: 4.5 g/dL (ref 3.6–5.1)
ALK PHOS: 79 U/L (ref 40–115)
ALT: 42 U/L (ref 9–46)
AST: 30 U/L (ref 10–40)
BILIRUBIN DIRECT: 0.2 mg/dL (ref <=0.2)
BILIRUBIN TOTAL: 0.8 mg/dL (ref 0.2–1.2)
Indirect Bilirubin: 0.6 mg/dL (ref 0.2–1.2)
Total Protein: 6.6 g/dL (ref 6.1–8.1)

## 2016-10-09 LAB — TSH: TSH: 3 m[IU]/L (ref 0.40–4.50)

## 2016-10-09 LAB — VITAMIN D 25 HYDROXY (VIT D DEFICIENCY, FRACTURES): Vit D, 25-Hydroxy: 15 ng/mL — ABNORMAL LOW (ref 30–100)

## 2016-10-09 LAB — MAGNESIUM: Magnesium: 2.1 mg/dL (ref 1.5–2.5)

## 2016-11-06 ENCOUNTER — Other Ambulatory Visit: Payer: Self-pay

## 2016-11-06 MED ORDER — BUPROPION HCL ER (XL) 150 MG PO TB24: 150.0000 mg | ORAL_TABLET | ORAL | 2 refills | Status: DC

## 2016-11-12 ENCOUNTER — Other Ambulatory Visit: Payer: 59

## 2016-11-12 DIAGNOSIS — N289 Disorder of kidney and ureter, unspecified: Secondary | ICD-10-CM

## 2016-11-13 LAB — BASIC METABOLIC PANEL WITH GFR
BUN: 15 mg/dL (ref 7–25)
CALCIUM: 9.6 mg/dL (ref 8.6–10.3)
CO2: 20 mmol/L (ref 20–31)
Chloride: 101 mmol/L (ref 98–110)
Creat: 1.28 mg/dL (ref 0.60–1.35)
GFR, EST AFRICAN AMERICAN: 77 mL/min (ref >=60)
GFR, EST NON AFRICAN AMERICAN: 66 mL/min (ref >=60)
GLUCOSE: 75 mg/dL (ref 65–99)
Potassium: 4.1 mmol/L (ref 3.5–5.3)
Sodium: 137 mmol/L (ref 135–146)

## 2016-12-12 ENCOUNTER — Other Ambulatory Visit: Payer: Self-pay | Admitting: Internal Medicine

## 2016-12-12 ENCOUNTER — Other Ambulatory Visit: Payer: Self-pay

## 2016-12-12 MED ORDER — OLMESARTAN MEDOXOMIL 40 MG PO TABS: 40.0000 mg | ORAL_TABLET | Freq: Every day | ORAL | 0 refills | Status: DC

## 2016-12-12 MED ORDER — CITALOPRAM HYDROBROMIDE 40 MG PO TABS: 40.0000 mg | ORAL_TABLET | Freq: Every day | ORAL | 2 refills | Status: DC

## 2017-01-27 NOTE — Progress Notes (Signed)
Complete Physical  Assessment and Plan:  Encounter for general adult medical examination with abnormal findings  Essential hypertension - continue medications, DASH diet, exercise and monitor at home. Call if greater than 130/80 - cut benicar in half with weight loss.  -     CBC with Differential/Platelet -     Hepatic function panel -     BASIC METABOLIC PANEL WITH GFR -     TSH -     Urinalysis, Routine w reflex microscopic -     Microalbumin / creatinine urine ratio -     EKG 12-Lead  Hyperlipidemia, unspecified hyperlipidemia type - check lipids off med and with weight loss, decrease fatty foods, increase activity.  -     Lipid panel  Prediabetes Discussed general issues about diabetes pathophysiology and management., Educational material distributed., Suggested low cholesterol diet., Encouraged aerobic exercise., Discussed foot care., Reminded to get yearly retinal exam.  Medication management -     Magnesium  Vitamin D deficiency -     VITAMIN D 25 Hydroxy (Vit-D Deficiency, Fractures)  Hypogonadism in male Might be better with weight loss, will recheck -     Testosterone  Erectile dysfunction, unspecified erectile dysfunction type  Depression, major, recurrent, in partial remission (HCC) - continue medications, stress management techniques discussed, increase water, good sleep hygiene discussed, increase exercise, and increase veggies.   Screening for prostate cancer -     PSA   Discussed med's effects and SE's. Screening labs and tests as requested with regular follow-up as recommended. Over 40 minutes of exam, counseling, chart review and critical decision making was performed  HPI Patient presents for a complete physical.   His blood pressure has been controlled at home, today their BP is BP: 110/70 He does not workout. He denies chest pain, shortness of breath, dizziness.  He is not on cholesterol medication, stopped pravastatin x 1 month wants to see how  LDL is with weight loss. His cholesterol is at goal. The cholesterol last visit was:   Lab Results  Component Value Date   CHOL 156 10/08/2016   HDL 40 (L) 10/08/2016   LDLCALC 93 10/08/2016   TRIG 114 10/08/2016   CHOLHDL 3.9 10/08/2016    Last A1C in the office was:  Lab Results  Component Value Date   HGBA1C 5.3 03/05/2016   Last GFR: Lab Results  Component Value Date   GFRNONAA 66 11/12/2016    Patient is on Vitamin D supplement, 4000 IU.   Lab Results  Component Value Date   VD25OH 15 (L) 10/08/2016     Last PSA was: Lab Results  Component Value Date   PSA 0.94 05/30/2015   BMI is Body mass index is 31.95 kg/m., he is working on diet and exercise. Wt Readings from Last 3 Encounters:  01/28/17 242 lb 3.2 oz (109.9 kg)  10/08/16 282 lb 6.4 oz (128.1 kg)  06/25/16 282 lb (127.9 kg)   He has a history of testosterone deficiency, he is not on anything at this time.  Lab Results  Component Value Date   TESTOSTERONE 191 (L) 05/28/2016    Current Medications:  Current Outpatient Prescriptions on File Prior to Visit  Medication Sig Dispense Refill  . ALPRAZolam (XANAX) 0.5 MG tablet Take 1 tablet (0.5 mg total) by mouth 2 (two) times daily as needed for anxiety. 60 tablet 0  . buPROPion (WELLBUTRIN XL) 150 MG 24 hr tablet Take 1 tablet (150 mg total) by mouth every morning. 30  tablet 2  . citalopram (CELEXA) 40 MG tablet Take 1 tablet (40 mg total) by mouth daily. 90 tablet 2  . olmesartan (BENICAR) 40 MG tablet Take 1 tablet (40 mg total) by mouth daily. 90 tablet 0  . pravastatin (PRAVACHOL) 40 MG tablet Take 1 tablet (40 mg total) by mouth daily. 90 tablet 0   No current facility-administered medications on file prior to visit.    Allergies:  No Known Allergies Health Maintenance:  Immunization History  Administered Date(s) Administered  . Influenza,inj,quad, With Preservative 03/05/2016   Tetanus: DUE but out of in the office Pneumovax: Prevnar 13: Flu  vaccine: 2017 Zostavax:  DEXA: Colonoscopy: EGD:  Patient Care Team: Lucky Cowboy, MD as PCP - General (Internal Medicine)  Medical History:  has Essential hypertension; Prediabetes; Hyperlipidemia; Medication management; Hypogonadism in male; Vitamin D deficiency; Erectile dysfunction; and Depression, major, recurrent, in partial remission (HCC) on his problem list. Surgical History:  He  has a past surgical history that includes Foot fracture surgery (1985). Family History:  His family history is not on file. Social History:   reports that he quit smoking about 26 years ago. He has never used smokeless tobacco. He reports that he does not drink alcohol or use drugs. Review of Systems:  Review of Systems  Constitutional: Negative.   HENT: Negative.   Eyes: Negative.   Respiratory: Negative.   Cardiovascular: Negative.   Gastrointestinal: Negative.   Genitourinary: Negative.   Musculoskeletal: Negative.   Skin: Negative.   Neurological: Negative.   Endo/Heme/Allergies: Negative.   Psychiatric/Behavioral: Negative.     Physical Exam: Estimated body mass index is 31.95 kg/m as calculated from the following:   Height as of this encounter: 6\' 1"  (1.854 m).   Weight as of this encounter: 242 lb 3.2 oz (109.9 kg). BP 110/70   Pulse 61   Temp (!) 97.5 F (36.4 C)   Resp 14   Ht 6\' 1"  (1.854 m)   Wt 242 lb 3.2 oz (109.9 kg)   SpO2 98%   BMI 31.95 kg/m  General Appearance: Well nourished, in no apparent distress.  Eyes: PERRLA, EOMs, conjunctiva no swelling or erythema, normal fundi and vessels.  Sinuses: No Frontal/maxillary tenderness  ENT/Mouth: Ext aud canals clear, normal light reflex with TMs without erythema, bulging. Good dentition. No erythema, swelling, or exudate on post pharynx. Tonsils not swollen or erythematous. Hearing normal.  Neck: Supple, thyroid normal. No bruits  Respiratory: Respiratory effort normal, BS equal bilaterally without rales, rhonchi,  wheezing or stridor.  Cardio: RRR without murmurs, rubs or gallops. Brisk peripheral pulses without edema.  Chest: symmetric, with normal excursions and percussion.  Abdomen: Soft, nontender, no guarding, rebound, hernias, masses, or organomegaly.  Lymphatics: Non tender without lymphadenopathy.  Genitourinary: defer Musculoskeletal: Full ROM all peripheral extremities,5/5 strength, and normal gait.  Skin: Warm, dry without rashes, lesions, ecchymosis. Neuro: Cranial nerves intact, reflexes equal bilaterally. Normal muscle tone, no cerebellar symptoms. Sensation intact.  Psych: Awake and oriented X 3, normal affect, Insight and Judgment appropriate.   EKG: WNL no changes. AORTA SCAN: defer  Bernard Thompson 3:34 PM [facility]

## 2017-01-28 ENCOUNTER — Encounter: Payer: Self-pay | Admitting: Physician Assistant

## 2017-01-28 ENCOUNTER — Ambulatory Visit (INDEPENDENT_AMBULATORY_CARE_PROVIDER_SITE_OTHER): Payer: 59 | Admitting: Physician Assistant

## 2017-01-28 VITALS — BP 110/70 | HR 61 | Temp 97.5°F | Resp 14 | Ht 73.0 in | Wt 242.2 lb

## 2017-01-28 DIAGNOSIS — Z125 Encounter for screening for malignant neoplasm of prostate: Secondary | ICD-10-CM | POA: Diagnosis not present

## 2017-01-28 DIAGNOSIS — I10 Essential (primary) hypertension: Secondary | ICD-10-CM

## 2017-01-28 DIAGNOSIS — R7303 Prediabetes: Secondary | ICD-10-CM

## 2017-01-28 DIAGNOSIS — E785 Hyperlipidemia, unspecified: Secondary | ICD-10-CM

## 2017-01-28 DIAGNOSIS — E559 Vitamin D deficiency, unspecified: Secondary | ICD-10-CM

## 2017-01-28 DIAGNOSIS — Z130 Encounter for screening for diseases of the blood and blood-forming organs and certain disorders involving the immune mechanism: Secondary | ICD-10-CM | POA: Diagnosis not present

## 2017-01-28 DIAGNOSIS — E291 Testicular hypofunction: Secondary | ICD-10-CM | POA: Diagnosis not present

## 2017-01-28 DIAGNOSIS — N529 Male erectile dysfunction, unspecified: Secondary | ICD-10-CM | POA: Diagnosis not present

## 2017-01-28 DIAGNOSIS — Z79899 Other long term (current) drug therapy: Secondary | ICD-10-CM | POA: Diagnosis not present

## 2017-01-28 DIAGNOSIS — F3341 Major depressive disorder, recurrent, in partial remission: Secondary | ICD-10-CM

## 2017-01-28 DIAGNOSIS — Z0001 Encounter for general adult medical examination with abnormal findings: Secondary | ICD-10-CM

## 2017-01-28 NOTE — Patient Instructions (Addendum)
Cut benicar in half and monitor BP  Continue to decrease your fatty foods, red meat, cheese, milk and increase fiber like whole grains and veggies. You can also add a fiber supplement like Metamucil or Benefiber that can help with weight loss and lower your LDL.   Remember exercise is great for your cardiovascular health and can help with weight loss but YOU CAN NOT OUT RUN YOUR FORK!

## 2017-01-29 LAB — PSA: PSA: 1 ng/mL (ref <=4.0)

## 2017-01-29 LAB — URINALYSIS, ROUTINE W REFLEX MICROSCOPIC
BILIRUBIN URINE: NEGATIVE
GLUCOSE, UA: NEGATIVE
HGB URINE DIPSTICK: NEGATIVE
KETONES UR: NEGATIVE
Leukocytes, UA: NEGATIVE
Nitrite: NEGATIVE
PROTEIN: NEGATIVE
Specific Gravity, Urine: 1.005 (ref 1.001–1.03)
pH: 6 (ref 5.0–8.0)

## 2017-01-29 LAB — BASIC METABOLIC PANEL WITH GFR
BUN: 13 mg/dL (ref 7–25)
CALCIUM: 9.8 mg/dL (ref 8.6–10.3)
CO2: 27 mmol/L (ref 20–32)
CREATININE: 1.15 mg/dL (ref 0.60–1.35)
Chloride: 94 mmol/L — ABNORMAL LOW (ref 98–110)
GFR, Est African American: 87 mL/min/{1.73_m2} (ref >=60)
GFR, Est Non African American: 75 mL/min/{1.73_m2} (ref >=60)
GLUCOSE: 87 mg/dL (ref 65–99)
Potassium: 4.3 mmol/L (ref 3.5–5.3)
Sodium: 132 mmol/L — ABNORMAL LOW (ref 135–146)

## 2017-01-29 LAB — CBC WITH DIFFERENTIAL/PLATELET
BASOS PCT: 0.8 %
Basophils Absolute: 58 cells/uL (ref 0–200)
Eosinophils Absolute: 122 cells/uL (ref 15–500)
Eosinophils Relative: 1.7 %
HCT: 43.3 % (ref 38.5–50.0)
Hemoglobin: 15.2 g/dL (ref 13.2–17.1)
LYMPHS ABS: 1692 {cells}/uL (ref 850–3900)
MCH: 28.9 pg (ref 27.0–33.0)
MCHC: 35.1 g/dL (ref 32.0–36.0)
MCV: 82.3 fL (ref 80.0–100.0)
MPV: 9.6 fL (ref 7.5–12.5)
Monocytes Relative: 6.7 %
Neutro Abs: 4846 cells/uL (ref 1500–7800)
Neutrophils Relative %: 67.3 %
PLATELETS: 300 10*3/uL (ref 140–400)
RBC: 5.26 10*6/uL (ref 4.20–5.80)
RDW: 12.8 % (ref 11.0–15.0)
TOTAL LYMPHOCYTE: 23.5 %
WBC: 7.2 10*3/uL (ref 3.8–10.8)
WBCMIX: 482 {cells}/uL (ref 200–950)

## 2017-01-29 LAB — MAGNESIUM: MAGNESIUM: 1.7 mg/dL (ref 1.5–2.5)

## 2017-01-29 LAB — VITAMIN D 25 HYDROXY (VIT D DEFICIENCY, FRACTURES): VIT D 25 HYDROXY: 51 ng/mL (ref 30–100)

## 2017-01-29 LAB — MICROALBUMIN / CREATININE URINE RATIO
CREATININE, URINE: 40 mg/dL (ref 20–370)
Microalb, Ur: <0.2 mg/dL

## 2017-01-29 LAB — LIPID PANEL
CHOL/HDL RATIO: 4.8 (calc) (ref <5.0)
CHOLESTEROL: 227 mg/dL — AB (ref <200)
HDL: 47 mg/dL (ref >40)
LDL Cholesterol (Calc): 153 mg/dL (calc) — ABNORMAL HIGH
NON-HDL CHOLESTEROL (CALC): 180 mg/dL — AB (ref <130)
Triglycerides: 138 mg/dL (ref <150)

## 2017-01-29 LAB — URINE CULTURE
MICRO NUMBER:: 80908709
Result:: NO GROWTH
SPECIMEN QUALITY:: ADEQUATE

## 2017-01-29 LAB — HEPATIC FUNCTION PANEL
AG Ratio: 2.1 (calc) (ref 1.0–2.5)
ALBUMIN MSPROF: 4.7 g/dL (ref 3.6–5.1)
ALT: 28 U/L (ref 9–46)
AST: 21 U/L (ref 10–40)
Alkaline phosphatase (APISO): 77 U/L (ref 40–115)
BILIRUBIN DIRECT: 0.2 mg/dL (ref 0.0–0.2)
GLOBULIN: 2.2 g/dL (ref 1.9–3.7)
Indirect Bilirubin: 0.8 mg/dL (calc) (ref 0.2–1.2)
TOTAL PROTEIN: 6.9 g/dL (ref 6.1–8.1)
Total Bilirubin: 1 mg/dL (ref 0.2–1.2)

## 2017-01-29 LAB — TSH: TSH: 2.92 mIU/L (ref 0.40–4.50)

## 2017-01-29 LAB — TESTOSTERONE: Testosterone: 292 ng/dL (ref 250–827)

## 2017-03-17 ENCOUNTER — Other Ambulatory Visit: Payer: Self-pay

## 2017-03-17 MED ORDER — BUPROPION HCL ER (XL) 150 MG PO TB24: 150.0000 mg | ORAL_TABLET | ORAL | 2 refills | Status: DC

## 2017-03-31 ENCOUNTER — Other Ambulatory Visit: Payer: Self-pay | Admitting: Physician Assistant

## 2017-03-31 ENCOUNTER — Encounter: Payer: Self-pay | Admitting: Physician Assistant

## 2017-03-31 MED ORDER — ALPRAZOLAM 0.5 MG PO TABS: 0.5000 mg | ORAL_TABLET | Freq: Two times a day (BID) | ORAL | 0 refills | Status: DC | PRN

## 2017-03-31 NOTE — Progress Notes (Signed)
Xanax has been called into pharmacy on 22nd Oct 2018 by DD 

## 2017-03-31 NOTE — Progress Notes (Unsigned)
Future Appointments Date Time Provider Department Center  05/12/2017 3:30 PM Quentin MullingCollier, Usiel Astarita, PA-C [facility] None  08/04/2017 2:30 PM Quentin Mullingollier, Kember Boch, PA-C [facility] None  02/10/2018 3:00 PM Quentin Mullingollier, Katsumi Wisler, PA-C [facility] None

## 2017-04-21 ENCOUNTER — Other Ambulatory Visit: Payer: Self-pay

## 2017-04-21 MED ORDER — OLMESARTAN MEDOXOMIL 40 MG PO TABS: 40.0000 mg | ORAL_TABLET | Freq: Every day | ORAL | 0 refills | Status: DC

## 2017-05-12 ENCOUNTER — Encounter: Payer: Self-pay | Admitting: Physician Assistant

## 2017-05-12 ENCOUNTER — Ambulatory Visit (INDEPENDENT_AMBULATORY_CARE_PROVIDER_SITE_OTHER): Payer: 59 | Admitting: Physician Assistant

## 2017-05-12 VITALS — BP 122/80 | HR 66 | Temp 97.3°F | Resp 14 | Ht 72.0 in | Wt 225.8 lb

## 2017-05-12 DIAGNOSIS — E785 Hyperlipidemia, unspecified: Secondary | ICD-10-CM

## 2017-05-12 DIAGNOSIS — I10 Essential (primary) hypertension: Secondary | ICD-10-CM | POA: Diagnosis not present

## 2017-05-12 DIAGNOSIS — Z79899 Other long term (current) drug therapy: Secondary | ICD-10-CM | POA: Diagnosis not present

## 2017-05-12 DIAGNOSIS — F3341 Major depressive disorder, recurrent, in partial remission: Secondary | ICD-10-CM

## 2017-05-12 NOTE — Patient Instructions (Signed)
Monitor your blood pressure at home. Go to the ER if any CP, SOB, nausea, dizziness, severe HA, changes vision/speech  Goal BP:  For patients younger than 60: Goal BP < 140/90. For patients 60 and older: Goal BP < 150/90. For patients with diabetes: Goal BP < 140/90. Your most recent BP: BP: 122/80   Take your medications faithfully as instructed. Maintain a healthy weight. Get at least 150 minutes of aerobic exercise per week. Minimize salt intake. Minimize alcohol intake  DASH Eating Plan DASH stands for "Dietary Approaches to Stop Hypertension." The DASH eating plan is a healthy eating plan that has been shown to reduce high blood pressure (hypertension). Additional health benefits may include reducing the risk of type 2 diabetes mellitus, heart disease, and stroke. The DASH eating plan may also help with weight loss. WHAT DO I NEED TO KNOW ABOUT THE DASH EATING PLAN? For the DASH eating plan, you will follow these general guidelines:  Choose foods with a percent daily value for sodium of less than 5% (as listed on the food label).  Use salt-free seasonings or herbs instead of table salt or sea salt.  Check with your health care provider or pharmacist before using salt substitutes.  Eat lower-sodium products, often labeled as "lower sodium" or "no salt added."  Eat fresh foods.  Eat more vegetables, fruits, and low-fat dairy products.  Choose whole grains. Look for the word "whole" as the first word in the ingredient list.  Choose fish and skinless chicken or turkey more often than red meat. Limit fish, poultry, and meat to 6 oz (170 g) each day.  Limit sweets, desserts, sugars, and sugary drinks.  Choose heart-healthy fats.  Limit cheese to 1 oz (28 g) per day.  Eat more home-cooked food and less restaurant, buffet, and fast food.  Limit fried foods.  Cook foods using methods other than frying.  Limit canned vegetables. If you do use them, rinse them well to  decrease the sodium.  When eating at a restaurant, ask that your food be prepared with less salt, or no salt if possible. WHAT FOODS CAN I EAT? Seek help from a dietitian for individual calorie needs. Grains Whole grain or whole wheat bread. Brown rice. Whole grain or whole wheat pasta. Quinoa, bulgur, and whole grain cereals. Low-sodium cereals. Corn or whole wheat flour tortillas. Whole grain cornbread. Whole grain crackers. Low-sodium crackers. Vegetables Fresh or frozen vegetables (raw, steamed, roasted, or grilled). Low-sodium or reduced-sodium tomato and vegetable juices. Low-sodium or reduced-sodium tomato sauce and paste. Low-sodium or reduced-sodium canned vegetables.  Fruits All fresh, canned (in natural juice), or frozen fruits. Meat and Other Protein Products Ground beef (85% or leaner), grass-fed beef, or beef trimmed of fat. Skinless chicken or turkey. Ground chicken or turkey. Pork trimmed of fat. All fish and seafood. Eggs. Dried beans, peas, or lentils. Unsalted nuts and seeds. Unsalted canned beans. Dairy Low-fat dairy products, such as skim or 1% milk, 2% or reduced-fat cheeses, low-fat ricotta or cottage cheese, or plain low-fat yogurt. Low-sodium or reduced-sodium cheeses. Fats and Oils Tub margarines without trans fats. Light or reduced-fat mayonnaise and salad dressings (reduced sodium). Avocado. Safflower, olive, or canola oils. Natural peanut or almond butter. Other Unsalted popcorn and pretzels. The items listed above may not be a complete list of recommended foods or beverages. Contact your dietitian for more options. WHAT FOODS ARE NOT RECOMMENDED? Grains White bread. White pasta. White rice. Refined cornbread. Bagels and croissants. Crackers that contain trans   fat. Vegetables Creamed or fried vegetables. Vegetables in a cheese sauce. Regular canned vegetables. Regular canned tomato sauce and paste. Regular tomato and vegetable juices. Fruits Dried fruits. Canned  fruit in light or heavy syrup. Fruit juice. Meat and Other Protein Products Fatty cuts of meat. Ribs, chicken wings, bacon, sausage, bologna, salami, chitterlings, fatback, hot dogs, bratwurst, and packaged luncheon meats. Salted nuts and seeds. Canned beans with salt. Dairy Whole or 2% milk, cream, half-and-half, and cream cheese. Whole-fat or sweetened yogurt. Full-fat cheeses or blue cheese. Nondairy creamers and whipped toppings. Processed cheese, cheese spreads, or cheese curds. Condiments Onion and garlic salt, seasoned salt, table salt, and sea salt. Canned and packaged gravies. Worcestershire sauce. Tartar sauce. Barbecue sauce. Teriyaki sauce. Soy sauce, including reduced sodium. Steak sauce. Fish sauce. Oyster sauce. Cocktail sauce. Horseradish. Ketchup and mustard. Meat flavorings and tenderizers. Bouillon cubes. Hot sauce. Tabasco sauce. Marinades. Taco seasonings. Relishes. Fats and Oils Butter, stick margarine, lard, shortening, ghee, and bacon fat. Coconut, palm kernel, or palm oils. Regular salad dressings. Other Pickles and olives. Salted popcorn and pretzels. The items listed above may not be a complete list of foods and beverages to avoid. Contact your dietitian for more information. WHERE CAN I FIND MORE INFORMATION? National Heart, Lung, and [employer] Institute: www.nhlbi.nih.gov/health/health-topics/topics/dash/ Document Released: 05/16/2011 Document Revised: 10/11/2013 Document Reviewed: 03/31/2013 ExitCare Patient Information 2015 [employer]. This information is not intended to replace advice given to you by your health care provider. Make sure you discuss any questions you have with your health care provider.  

## 2017-05-12 NOTE — Progress Notes (Signed)
Assessment and Plan:  Hypertension:  -off medication with weight loss -monitor blood pressure at home, all if over 140/90 -Continue DASH diet.   -Reminder to go to the ER if any CP, SOB, nausea, dizziness, severe HA, changes vision/speech, left arm numbness and tingling, and jaw pain.  Cholesterol: -Continue diet and exercise.  -Check cholesterol of meds  Vitamin D Def: -check level -continue medications.   Anxiety and Depression -xanax refilled  Hypogonadism - continue replacement therapy, will try testim, check testosterone levels as needed.   Morbid Obesity with co morbidities - long discussion about weight loss, diet, and exercise  Continue diet and meds as discussed. Further disposition pending results of labs. Future Appointments  Date Time Provider Department Center  08/04/2017  2:30 PM Quentin MullingCollier, Breauna Mazzeo, PA-C [facility] None  02/10/2018  3:00 PM Quentin Mullingollier, Karlon Schlafer, PA-C [facility] None    HPI 46 y.o. male  presents for 3 month follow up with hypertension, hyperlipidemia, prediabetes and vitamin D.   His blood pressure has been controlled at hom, HAS BEEN OF BENICAR X 1 WEEK, today their BP is BP: 122/80.   He does not workout. He denies chest pain, shortness of breath, dizziness.     He is not on cholesterol medication, HAS STOPPED X 3 MONTHS and denies myalgias. His cholesterol is at goal. The cholesterol last visit was:   Lab Results  Component Value Date   CHOL 227 (H) 01/28/2017   HDL 47 01/28/2017   LDLCALC 93 10/08/2016   TRIG 138 01/28/2017   CHOLHDL 4.8 01/28/2017     He has been working on diet and exercise for prediabetes, and denies foot ulcerations, hyperglycemia, hypoglycemia , increased appetite, nausea, paresthesia of the feet, polydipsia, polyuria, visual disturbances, vomiting and weight loss. Last A1C in the office was:  Lab Results  Component Value Date   HGBA1C 5.3 03/05/2016   Patient is on Vitamin D supplement.  Lab Results  Component Value  Date   VD25OH 3051 01/28/2017     He reports that he has some issues with anxiety.  He reports that he rarely uses the xanax.  He is on the wellbutrin and celexa.   BMI is Body mass index is 30.62 kg/m., he is working on diet and exercise. He has cut back on carbs and portions.  Wt Readings from Last 3 Encounters:  05/12/17 225 lb 12.8 oz (102.4 kg)  01/28/17 242 lb 3.2 oz (109.9 kg)  10/08/16 282 lb 6.4 oz (128.1 kg)   He has a history of testosterone deficiency and is on testosterone replacement,  He states that the testosterone helps with his energy, libido, muscle mass. Lab Results  Component Value Date   TESTOSTERONE 292 01/28/2017    Current Medications:  Current Outpatient Medications on File Prior to Visit  Medication Sig Dispense Refill  . ALPRAZolam (XANAX) 0.5 MG tablet Take 1 tablet (0.5 mg total) by mouth 2 (two) times daily as needed for anxiety. 60 tablet 0  . buPROPion (WELLBUTRIN XL) 150 MG 24 hr tablet Take 1 tablet (150 mg total) by mouth every morning. 30 tablet 2  . citalopram (CELEXA) 40 MG tablet Take 1 tablet (40 mg total) by mouth daily. 90 tablet 2  . olmesartan (BENICAR) 40 MG tablet Take 1 tablet (40 mg total) daily by mouth. (Patient not taking: Reported on 05/12/2017) 90 tablet 0  . pravastatin (PRAVACHOL) 40 MG tablet Take 1 tablet (40 mg total) by mouth daily. (Patient not taking: Reported on 05/12/2017) 90  tablet 0   No current facility-administered medications on file prior to visit.     Medical History:  Past Medical History:  Diagnosis Date  . Anxiety   . Depression   . HLD (hyperlipidemia)   . HTN (hypertension)     Allergies: No Known Allergies   Review of Systems:  Review of Systems  Constitutional: Negative for chills, fever and malaise/fatigue.  HENT: Negative for congestion, ear pain and sore throat.   Eyes: Negative.   Respiratory: Negative for cough, shortness of breath and wheezing.   Cardiovascular: Negative for chest pain,  palpitations and leg swelling.  Gastrointestinal: Negative for abdominal pain, blood in stool, constipation, diarrhea, heartburn and melena.  Genitourinary: Negative.   Skin: Negative.   Neurological: Negative for dizziness, sensory change, loss of consciousness and headaches.  Psychiatric/Behavioral: Negative for depression. The patient is not nervous/anxious and does not have insomnia.     Family history- Review and unchanged  Social history- Review and unchanged  Physical Exam: BP 122/80   Pulse 66   Temp (!) 97.3 F (36.3 C)   Resp 14   Ht 6' (1.829 m)   Wt 225 lb 12.8 oz (102.4 kg)   SpO2 98%   BMI 30.62 kg/m  Wt Readings from Last 3 Encounters:  05/12/17 225 lb 12.8 oz (102.4 kg)  01/28/17 242 lb 3.2 oz (109.9 kg)  10/08/16 282 lb 6.4 oz (128.1 kg)    General Appearance: Well nourished well developed, in no apparent distress. Eyes: PERRLA, EOMs, conjunctiva no swelling or erythema ENT/Mouth: Ear canals normal without obstruction, swelling, erythma, discharge.  TMs normal bilaterally.  Oropharynx moist, clear, without exudate, or postoropharyngeal swelling. Neck: Supple, thyroid normal,no cervical adenopathy  Respiratory: Respiratory effort normal, Breath sounds clear A&P without rhonchi, wheeze, or rale.  No retractions, no accessory usage. Cardio: RRR with no MRGs. Brisk peripheral pulses without edema.  Abdomen: Soft, + BS,  Non tender, no guarding, rebound, hernias, masses. Musculoskeletal: Full ROM, 5/5 strength, Normal gait Skin: Warm, dry without rashes, lesions, ecchymosis.  Neuro: Awake and oriented X 3, Cranial nerves intact. Normal muscle tone, no cerebellar symptoms. Psych: Normal affect, Insight and Judgment appropriate.    Quentin MullingAmanda Tanise Russman, PA-C 4:02 PM [facility]

## 2017-05-13 LAB — BASIC METABOLIC PANEL WITH GFR
BUN: 11 mg/dL (ref 7–25)
CALCIUM: 10.1 mg/dL (ref 8.6–10.3)
CHLORIDE: 104 mmol/L (ref 98–110)
CO2: 29 mmol/L (ref 20–32)
Creat: 1.3 mg/dL (ref 0.60–1.35)
GFR, EST AFRICAN AMERICAN: 75 mL/min/{1.73_m2} (ref >=60)
GFR, Est Non African American: 65 mL/min/{1.73_m2} (ref >=60)
Glucose, Bld: 93 mg/dL (ref 65–99)
POTASSIUM: 5.1 mmol/L (ref 3.5–5.3)
SODIUM: 141 mmol/L (ref 135–146)

## 2017-05-13 LAB — CBC WITH DIFFERENTIAL/PLATELET
Basophils Absolute: 59 cells/uL (ref 0–200)
Basophils Relative: 1 %
Eosinophils Absolute: 130 cells/uL (ref 15–500)
Eosinophils Relative: 2.2 %
HCT: 40.5 % (ref 38.5–50.0)
Hemoglobin: 14.2 g/dL (ref 13.2–17.1)
Lymphs Abs: 1593 cells/uL (ref 850–3900)
MCH: 29 pg (ref 27.0–33.0)
MCHC: 35.1 g/dL (ref 32.0–36.0)
MCV: 82.8 fL (ref 80.0–100.0)
MPV: 10.2 fL (ref 7.5–12.5)
Monocytes Relative: 5.4 %
NEUTROS PCT: 64.4 %
Neutro Abs: 3800 cells/uL (ref 1500–7800)
PLATELETS: 287 10*3/uL (ref 140–400)
RBC: 4.89 10*6/uL (ref 4.20–5.80)
RDW: 12.2 % (ref 11.0–15.0)
TOTAL LYMPHOCYTE: 27 %
WBC: 5.9 10*3/uL (ref 3.8–10.8)
WBCMIX: 319 {cells}/uL (ref 200–950)

## 2017-05-13 LAB — HEPATIC FUNCTION PANEL
AG Ratio: 1.9 (calc) (ref 1.0–2.5)
ALT: 20 U/L (ref 9–46)
AST: 21 U/L (ref 10–40)
Albumin: 4.5 g/dL (ref 3.6–5.1)
Alkaline phosphatase (APISO): 61 U/L (ref 40–115)
BILIRUBIN DIRECT: 0.1 mg/dL (ref 0.0–0.2)
BILIRUBIN INDIRECT: 0.7 mg/dL (ref 0.2–1.2)
Globulin: 2.4 g/dL (calc) (ref 1.9–3.7)
Total Bilirubin: 0.8 mg/dL (ref 0.2–1.2)
Total Protein: 6.9 g/dL (ref 6.1–8.1)

## 2017-05-13 LAB — LIPID PANEL
CHOLESTEROL: 197 mg/dL (ref <200)
HDL: 49 mg/dL (ref >40)
LDL Cholesterol (Calc): 127 mg/dL (calc) — ABNORMAL HIGH
Non-HDL Cholesterol (Calc): 148 mg/dL (calc) — ABNORMAL HIGH (ref <130)
Total CHOL/HDL Ratio: 4 (calc) (ref <5.0)
Triglycerides: 107 mg/dL (ref <150)

## 2017-05-13 LAB — TSH: TSH: 2.84 mIU/L (ref 0.40–4.50)

## 2017-06-08 NOTE — Progress Notes (Signed)
Assessment and Plan:  Bernard Thompson was seen today for rash.  Diagnoses and all orders for this visit:  Rash and nonspecific skin eruption Scattered rash of undetermined etiology - not itchy/painful, no signs strongly suggestive of infectious origin Advised to switch vitamin D back to previous brand, could be related to stress Discussed bed linen hygiene and possible exposure to insects by dogs Will try steroid - patient to call to report response to treatment - if worsening by the end of week will refer to dermatology -     predniSONE (DELTASONE) 20 MG tablet; 2 tablets daily for 3 days, 1 tablet daily for 4 days.  Further disposition pending results of labs. Discussed med's effects and SE's.   Over 15 minutes of exam, counseling, chart review, and critical decision making was performed.   Future Appointments  Date Time Provider Department Center  02/10/2018  3:00 PM Quentin Mullingollier, Amanda, PA-C [facility] None   ------------------------------------------------------------------------------------------------------------------   HPI BP 120/84   Pulse 68   Temp 97.9 F (36.6 C)   Ht 6' (1.829 m)   Wt 224 lb (101.6 kg)   SpO2 94%   BMI 30.38 kg/m   46 y.o.male presents for concerns regarding a rash on his BUE; now has on lower extremities as well. ongoing x 1.5 weeks. Started on antecubital folds and has spread over bilateral UE and now has on LE as well. He denies itching/burning/pain - he denies new detergents/hygiene products. States he does sleep with his dogs, washes sheets weekly. He does endorse increased stress prior to onset. No new medications, but states he switched to a new brand of vitamin D within a week prior to onset of rash. Denies any travel, unusual activities, exposure to environment.    Past Medical History:  Diagnosis Date  . Anxiety   . Depression   . HLD (hyperlipidemia)   . HTN (hypertension)      No Known Allergies  Current Outpatient Medications on File Prior  to Visit  Medication Sig  . ALPRAZolam (XANAX) 0.5 MG tablet Take 1 tablet (0.5 mg total) by mouth 2 (two) times daily as needed for anxiety.  Marland Kitchen. buPROPion (WELLBUTRIN XL) 150 MG 24 hr tablet Take 1 tablet (150 mg total) by mouth every morning.  . Cholecalciferol (VITAMIN D3) 5000 units CAPS Take by mouth.  . citalopram (CELEXA) 40 MG tablet Take 1 tablet (40 mg total) by mouth daily.  Marland Kitchen. olmesartan (BENICAR) 40 MG tablet Take 1 tablet (40 mg total) daily by mouth.  . pravastatin (PRAVACHOL) 40 MG tablet Take 1 tablet (40 mg total) by mouth daily.   No current facility-administered medications on file prior to visit.     ROS: Review of Systems  Constitutional: Negative for chills, fever and malaise/fatigue.  HENT: Negative.   Eyes: Negative.   Respiratory: Negative.  Negative for cough, shortness of breath and wheezing.   Cardiovascular: Negative.  Negative for chest pain, palpitations and leg swelling.  Gastrointestinal: Negative for abdominal pain, diarrhea, nausea and vomiting.  Genitourinary: Negative.   Musculoskeletal: Negative for joint pain and myalgias.  Skin: Positive for rash. Negative for itching.  Neurological: Negative for dizziness, sensory change, focal weakness, weakness and headaches.  Endo/Heme/Allergies: Does not bruise/bleed easily.  Psychiatric/Behavioral: The patient is not nervous/anxious.     Physical Exam:  BP 120/84   Pulse 68   Temp 97.9 F (36.6 C)   Ht 6' (1.829 m)   Wt 224 lb (101.6 kg)   SpO2 94%  BMI 30.38 kg/m   General Appearance: Well nourished, in no apparent distress. Neck: Supple.  Respiratory: Respiratory effort normal, BS equal bilaterally without rales, rhonchi, wheezing or stridor.  Cardio: RRR with no MRGs. Brisk peripheral pulses without edema.  Abdomen: Soft, + BS.  Non tender, no guarding. Lymphatics: Non tender without lymphadenopathy.  Musculoskeletal: Full ROM, 5/5 strength, normal gait.  Skin: Warm, dry; no ecchymosis;  scattered, flat small (1-225mm) red areas to bilateral upper extremities and lower extremities. Some areas with central scabbing.  No texture, discharge Neuro: Cranial nerves intact. Normal muscle tone, no cerebellar symptoms. Sensation intact.  Psych: Awake and oriented X 3, normal affect, Insight and Judgment appropriate.     Dan MakerAshley C Cassia Fein, NP 11:26 AM [facility]

## 2017-06-09 ENCOUNTER — Ambulatory Visit (INDEPENDENT_AMBULATORY_CARE_PROVIDER_SITE_OTHER): Payer: 59 | Admitting: Adult Health

## 2017-06-09 ENCOUNTER — Encounter: Payer: Self-pay | Admitting: Adult Health

## 2017-06-09 VITALS — BP 120/84 | HR 68 | Temp 97.9°F | Ht 72.0 in | Wt 224.0 lb

## 2017-06-09 DIAGNOSIS — R21 Rash and other nonspecific skin eruption: Secondary | ICD-10-CM

## 2017-06-09 MED ORDER — PREDNISONE 20 MG PO TABS: ORAL_TABLET | ORAL | 0 refills | Status: DC

## 2017-06-09 NOTE — Patient Instructions (Addendum)
.  asRash A rash is a change in the color of the skin. A rash can also change the way your skin feels. There are many different conditions and factors that can cause a rash. Follow these instructions at home: Pay attention to any changes in your symptoms. Follow these instructions to help with your condition: Medicine Take or apply over-the-counter and prescription medicines only as told by your health care provider. These may include:  Corticosteroid cream.  Anti-itch lotions.  Oral antihistamines.  Skin Care  Apply cool compresses to the affected areas.  Try taking a bath with: ? Epsom salts. Follow the instructions on the packaging. You can get these at your local pharmacy or grocery store. ? Baking soda. Pour a small amount into the bath as told by your health care provider. ? Colloidal oatmeal. Follow the instructions on the packaging. You can get this at your local pharmacy or grocery store.  Try applying baking soda paste to your skin. Stir water into baking soda until it reaches a paste-like consistency.  Do not scratch or rub your skin.  Avoid covering the rash. Make sure the rash is exposed to air as much as possible. General instructions  Avoid hot showers or baths, which can make itching worse. A cold shower may help.  Avoid scented soaps, detergents, and perfumes. Use gentle soaps, detergents, perfumes, and other cosmetic products.  Avoid any substance that causes your rash. Keep a journal to help track what causes your rash. Write down: ? What you eat. ? What cosmetic products you use. ? What you drink. ? What you wear. This includes jewelry.  Keep all follow-up visits as told by your health care provider. This is important. Contact a health care provider if:  You sweat at night.  You lose weight.  You urinate more than normal.  You feel weak.  You vomit.  Your skin or the whites of your eyes look yellow (jaundice).  Your skin: ? Tingles. ? Is  numb.  Your rash: ? Does not go away after several days. ? Gets worse.  You are: ? Unusually thirsty. ? More tired than normal.  You have: ? New symptoms. ? Pain in your abdomen. ? A fever. ? Diarrhea. Get help right away if:  You develop a rash that covers all or most of your body. The rash may or may not be painful.  You develop blisters that: ? Are on top of the rash. ? Grow larger or grow together. ? Are painful. ? Are inside your nose or mouth.  You develop a rash that: ? Looks like purple pinprick-sized spots all over your body. ? Has a "bull's eye" or looks like a target. ? Is not related to sun exposure, is red and painful, and causes your skin to peel. This information is not intended to replace advice given to you by your health care provider. Make sure you discuss any questions you have with your health care provider. Document Released: 05/17/2002 Document Revised: 10/31/2015 Document Reviewed: 10/12/2014 Elsevier Interactive Patient Education  2018 [employer]

## 2017-06-18 ENCOUNTER — Other Ambulatory Visit: Payer: Self-pay

## 2017-06-18 MED ORDER — BUPROPION HCL ER (XL) 150 MG PO TB24: 150.0000 mg | ORAL_TABLET | ORAL | 2 refills | Status: DC

## 2017-06-23 ENCOUNTER — Encounter: Payer: Self-pay | Admitting: Physician Assistant

## 2017-06-23 ENCOUNTER — Encounter: Payer: Self-pay | Admitting: Adult Health

## 2017-06-23 ENCOUNTER — Other Ambulatory Visit: Payer: Self-pay | Admitting: Adult Health

## 2017-06-23 DIAGNOSIS — R21 Rash and other nonspecific skin eruption: Secondary | ICD-10-CM

## 2017-06-24 NOTE — Progress Notes (Signed)
Assessment and Plan:  Bernard Thompson was seen today for follow-up.  Diagnoses and all orders for this visit:  Rash of entire body Pending dermatology appointment will proceed with labs Patient to report any new or different symptoms, fever/chills, severe pain, weakness, etc.  -     Sedimentation rate -     Anti-DNA antibody, double-stranded -     Anti-Smith antibody -     Cyclic citrul peptide antibody, IgG -     CBC with Differential/Platelet -     RPR  Further disposition pending results of labs. Discussed med's effects and SE's.   Over 15 minutes of exam, counseling, chart review, and critical decision making was performed.   Future Appointments  Date Time Provider Department Center  02/19/2018  2:00 PM [name], PA-C [facility] None    ------------------------------------------------------------------------------------------------------------------   HPI BP 122/86   Pulse 72   Temp (!) 97.3 F (36.3 C)   Ht 6' (1.829 m)   Wt 229 lb (103.9 kg)   SpO2 94%   BMI 31.06 kg/m   47 y.o.male presents for worsening rash; previously on upper and lower extremities, now covering body from neck to feet except face and palms/soles. He was scheduled to see dermatology this week per recommendation at the last visit but was canceled and rescheduled for 2/16 by derm office. A new referral to derm has also been made to see if he can be seen sooner elsewhere, but this has not been scheduled yet. He was prescribed prednisone at the last visit. He reports this did not seem to significantly help or acutely worsen. Rash continued to spread slowly.  The rash presents as small scattered round lesions ranging from 1-5 mm each, red, faintly raised, some with scabbed centers. Patient denies itching, pain or discharge.   At the last visit he did verify that his dogs did not appear to have fleas or other infestations, placed on medication, changed bedding, etc per recommendation. He does endorse new  notable fatigue for past month coinciding with rash. Rash is worst on upper extremities, where started originally. Previous lesions do not appear to be clearing. Patient denies HA, joint pain, fever/chills, n/v/d, vision changes, genital discharge. He has had 1 sexual partner in past several years (wife of several years, now separated).   Past Medical History:  Diagnosis Date  . Anxiety   . Depression   . HLD (hyperlipidemia)   . HTN (hypertension)      No Known Allergies  Current Outpatient Medications on File Prior to Visit  Medication Sig  . ALPRAZolam (XANAX) 0.5 MG tablet Take 1 tablet (0.5 mg total) by mouth 2 (two) times daily as needed for anxiety.  Marland Kitchen buPROPion (WELLBUTRIN XL) 150 MG 24 hr tablet Take 1 tablet (150 mg total) by mouth every morning.  . Cholecalciferol (VITAMIN D3) 5000 units CAPS Take by mouth.  . citalopram (CELEXA) 40 MG tablet Take 1 tablet (40 mg total) by mouth daily.  Marland Kitchen olmesartan (BENICAR) 40 MG tablet Take 1 tablet (40 mg total) daily by mouth. (Patient not taking: Reported on 06/25/2017)  . pravastatin (PRAVACHOL) 40 MG tablet Take 1 tablet (40 mg total) by mouth daily. (Patient not taking: Reported on 06/25/2017)  . predniSONE (DELTASONE) 20 MG tablet 2 tablets daily for 3 days, 1 tablet daily for 4 days. (Patient not taking: Reported on 06/25/2017)   No current facility-administered medications on file prior to visit.     ROS: all negative except above.   Physical  Exam:  BP 122/86   Pulse 72   Temp (!) 97.3 F (36.3 C)   Ht 6' (1.829 m)   Wt 229 lb (103.9 kg)   SpO2 94%   BMI 31.06 kg/m   General Appearance: Well nourished, in no apparent distress. Eyes: PERRLA, EOMs, conjunctiva no swelling or erythema Sinuses: No Frontal/maxillary tenderness ENT/Mouth:  No erythema, swelling, or exudate on post pharynx.  Tonsils not swollen or erythematous. Hearing normal.  Neck: Supple, thyroid normal.  Respiratory: Respiratory effort normal, BS equal  bilaterally without rales, rhonchi, wheezing or stridor.  Cardio: RRR with no MRGs. Brisk peripheral pulses without edema.  Abdomen: Soft, + BS.  Non tender, no guarding, rebound, palpable masses. Lymphatics: Non tender without lymphadenopathy.  Musculoskeletal: Full ROM, 5/5 strength, normal gait.  Skin: Warm, dry, with scattered nonconfluent maculopapular rash, each lesion 1-5 mm, erythematous, some with scab- like center - non appear to be vesicular/pustules - no apparent discharge, without ecchymosis.  Neuro: Cranial nerves intact. Normal muscle tone, no cerebellar symptoms. Sensation intact.  Psych: Awake and oriented X 3, normal affect, Insight and Judgment appropriate.     Dan MakerAshley C Charley Miske, NP 3:22 PM [facility]

## 2017-06-25 ENCOUNTER — Ambulatory Visit (INDEPENDENT_AMBULATORY_CARE_PROVIDER_SITE_OTHER): Payer: 59 | Admitting: Adult Health

## 2017-06-25 ENCOUNTER — Encounter: Payer: Self-pay | Admitting: Adult Health

## 2017-06-25 VITALS — BP 122/86 | HR 72 | Temp 97.3°F | Ht 72.0 in | Wt 229.0 lb

## 2017-06-25 DIAGNOSIS — R21 Rash and other nonspecific skin eruption: Secondary | ICD-10-CM | POA: Diagnosis not present

## 2017-06-26 ENCOUNTER — Other Ambulatory Visit: Payer: Self-pay | Admitting: Physician Assistant

## 2017-06-26 LAB — CBC WITH DIFFERENTIAL/PLATELET
BASOS PCT: 1.1 %
Basophils Absolute: 68 cells/uL (ref 0–200)
Eosinophils Absolute: 136 cells/uL (ref 15–500)
Eosinophils Relative: 2.2 %
HEMATOCRIT: 42.5 % (ref 38.5–50.0)
HEMOGLOBIN: 15 g/dL (ref 13.2–17.1)
LYMPHS ABS: 1383 {cells}/uL (ref 850–3900)
MCH: 29.1 pg (ref 27.0–33.0)
MCHC: 35.3 g/dL (ref 32.0–36.0)
MCV: 82.5 fL (ref 80.0–100.0)
MPV: 10.1 fL (ref 7.5–12.5)
Monocytes Relative: 7.5 %
Neutro Abs: 4148 cells/uL (ref 1500–7800)
Neutrophils Relative %: 66.9 %
PLATELETS: 260 10*3/uL (ref 140–400)
RBC: 5.15 10*6/uL (ref 4.20–5.80)
RDW: 12.2 % (ref 11.0–15.0)
TOTAL LYMPHOCYTE: 22.3 %
WBC: 6.2 10*3/uL (ref 3.8–10.8)
WBCMIX: 465 {cells}/uL (ref 200–950)

## 2017-06-26 LAB — CYCLIC CITRUL PEPTIDE ANTIBODY, IGG: Cyclic Citrullin Peptide Ab: <16 UNITS

## 2017-06-26 LAB — ANTI-DNA ANTIBODY, DOUBLE-STRANDED

## 2017-06-26 LAB — ANTI-SMITH ANTIBODY: ENA SM AB SER-ACNC: NEGATIVE AI

## 2017-06-26 LAB — SEDIMENTATION RATE: Sed Rate: 2 mm/h (ref 0–15)

## 2017-06-26 LAB — RPR: RPR: NONREACTIVE

## 2017-06-26 MED ORDER — ALPRAZOLAM 0.5 MG PO TABS: 0.5000 mg | ORAL_TABLET | Freq: Two times a day (BID) | ORAL | 0 refills | Status: DC | PRN

## 2017-07-02 ENCOUNTER — Encounter: Payer: Self-pay | Admitting: Physician Assistant

## 2017-07-03 ENCOUNTER — Encounter: Payer: Self-pay | Admitting: Physician Assistant

## 2017-07-03 ENCOUNTER — Ambulatory Visit (INDEPENDENT_AMBULATORY_CARE_PROVIDER_SITE_OTHER): Payer: 59 | Admitting: Physician Assistant

## 2017-07-03 VITALS — BP 124/80 | HR 91 | Temp 97.5°F | Resp 16 | Ht 72.0 in | Wt 240.0 lb

## 2017-07-03 DIAGNOSIS — N6320 Unspecified lump in the left breast, unspecified quadrant: Secondary | ICD-10-CM | POA: Diagnosis not present

## 2017-07-03 DIAGNOSIS — R21 Rash and other nonspecific skin eruption: Secondary | ICD-10-CM | POA: Diagnosis not present

## 2017-07-03 NOTE — Progress Notes (Signed)
   Subjective:    Patient ID: Bernard Thompson, male    DOB: 1970-12-17, 47 y.o.   MRN: [id number]  HPI 47 y.o. WM presents with lump on left breast. Noticed yesterday while in the shower. Not any larger, smaller, non tender, no warmth, redness. No prostate or breast cancer history in his family that he knows of. MGM had cancer but she was older and is unsure of the primary. No weight loss, no night sweats. Has had diffuse rash that he is going to see derm for.   Blood pressure 124/80, pulse 91, temperature (!) 97.5 F (36.4 C), resp. rate 16, height 6' (1.829 m), weight 240 lb (108.9 kg), SpO2 98 %.  Medications Current Outpatient Medications on File Prior to Visit  Medication Sig  . ALPRAZolam (XANAX) 0.5 MG tablet Take 1 tablet (0.5 mg total) by mouth 2 (two) times daily as needed for anxiety.  Marland Kitchen. buPROPion (WELLBUTRIN XL) 150 MG 24 hr tablet Take 1 tablet (150 mg total) by mouth every morning.  . Cholecalciferol (VITAMIN D3) 5000 units CAPS Take by mouth.  . citalopram (CELEXA) 40 MG tablet Take 1 tablet (40 mg total) by mouth daily.   No current facility-administered medications on file prior to visit.     Problem list He has Essential hypertension; Prediabetes; Hyperlipidemia; Medication management; Hypogonadism in male; Vitamin D deficiency; Erectile dysfunction; and Depression, major, recurrent, in partial remission (HCC) on their problem list.  He has a family history includes Cancer in his maternal grandmother; Diabetes in his maternal grandfather and mother; Heart disease in his mother; Hypertension in his mother.  Review of Systems  Constitutional: Negative.   HENT: Negative.   Respiratory: Negative.   Cardiovascular: Negative.   Gastrointestinal: Negative.   Genitourinary: Negative.   Musculoskeletal: Negative for arthralgias, back pain, gait problem, joint swelling, myalgias, neck pain and neck stiffness.  Skin: Positive for rash. Negative for color change, pallor and wound.   Neurological: Negative.        Objective:   Physical Exam  Pulmonary/Chest:    No lymphadenopathy  Skin:  Diffuse macular papular rash          Assessment & Plan:   Left breast mass -     US BREAST LTD UNI LEFT INC AXILLA; Future -     MM DIAG BREAST TOMO UNI LEFT; Future  Rash of entire body Follow up Derm   Future Appointments  Date Time Provider Department Center  02/19/2018  2:00 PM Quentin Mullingollier, Cordon Gassett, PA-C [facility] None

## 2017-07-04 ENCOUNTER — Ambulatory Visit: Payer: Self-pay | Admitting: Physician Assistant

## 2017-07-14 ENCOUNTER — Ambulatory Visit
Admission: RE | Admit: 2017-07-14 | Discharge: 2017-07-14 | Disposition: A | Discharge status: Home / Self Care | Payer: 59 | Source: Ambulatory Visit | Attending: Physician Assistant | Admitting: Physician Assistant

## 2017-07-14 DIAGNOSIS — N6320 Unspecified lump in the left breast, unspecified quadrant: Secondary | ICD-10-CM | POA: Diagnosis not present

## 2017-07-14 DIAGNOSIS — R928 Other abnormal and inconclusive findings on diagnostic imaging of breast: Secondary | ICD-10-CM | POA: Diagnosis not present

## 2017-07-24 DIAGNOSIS — L309 Dermatitis, unspecified: Secondary | ICD-10-CM | POA: Diagnosis not present

## 2017-07-24 DIAGNOSIS — L418 Other parapsoriasis: Secondary | ICD-10-CM | POA: Diagnosis not present

## 2017-08-04 ENCOUNTER — Ambulatory Visit: Payer: Self-pay | Admitting: Physician Assistant

## 2017-08-04 DIAGNOSIS — Z79899 Other long term (current) drug therapy: Secondary | ICD-10-CM | POA: Diagnosis not present

## 2017-08-04 DIAGNOSIS — L409 Psoriasis, unspecified: Secondary | ICD-10-CM | POA: Diagnosis not present

## 2017-09-13 ENCOUNTER — Other Ambulatory Visit: Payer: Self-pay | Admitting: Physician Assistant

## 2017-12-19 ENCOUNTER — Other Ambulatory Visit: Payer: Self-pay | Admitting: Physician Assistant

## 2018-02-10 ENCOUNTER — Encounter: Payer: Self-pay | Admitting: Physician Assistant

## 2018-02-17 NOTE — Progress Notes (Signed)
Complete Physical  Assessment and Plan:  Encounter for general adult medical examination with abnormal findings  Essential hypertension - continue medications, DASH diet, exercise and monitor at home. Call if greater than 130/80 - cut benicar in half with weight loss.  -     CBC with Differential/Platelet -     Hepatic function panel -     BASIC METABOLIC PANEL WITH GFR -     TSH -     Urinalysis, Routine w reflex microscopic -     Microalbumin / creatinine urine ratio -     EKG 12-Lead  Hyperlipidemia, unspecified hyperlipidemia type - check lipids off med and with weight loss, decrease fatty foods, increase activity.  -     Lipid panel  Prediabetes Discussed general issues about diabetes pathophysiology and management., Educational material distributed., Suggested low cholesterol diet., Encouraged aerobic exercise., Discussed foot care., Reminded to get yearly retinal exam.  Medication management -     Magnesium  Vitamin D deficiency -     VITAMIN D 25 Hydroxy (Vit-D Deficiency, Fractures)  Hypogonadism in male Might be better with weight loss, will recheck -     Testosterone  Erectile dysfunction, unspecified erectile dysfunction type  Depression, major, recurrent, in partial remission (HCC) - continue medications, stress management techniques discussed, increase water, good sleep hygiene discussed, increase exercise, and increase veggies. Declines addition of wellbutrin at this time but will message if he would like a RX    Discussed med's effects and SE's. Screening labs and tests as requested with regular follow-up as recommended. Over 40 minutes of exam, counseling, chart review and critical decision making was performed  HPI Patient presents for a complete physical.   His blood pressure has been controlled at home, today their BP is BP: 126/84 He does not workout. He denies chest pain, shortness of breath, dizziness.  He was started on humira for a rash, we do  not have notes at this time, has been on it x march, not better.  He has had some depression and weight gain with it. He is still on the celelxa  BMI is Body mass index is 34.39 kg/m., he is working on diet and exercise. Wt Readings from Last 3 Encounters:  02/19/18 253 lb 9.6 oz (115 kg)  07/03/17 240 lb (108.9 kg)  06/25/17 229 lb (103.9 kg)    He is not on cholesterol medication.  His cholesterol is at goal. The cholesterol last visit was:   Lab Results  Component Value Date   CHOL 197 05/12/2017   HDL 49 05/12/2017   LDLCALC 127 (H) 05/12/2017   TRIG 107 05/12/2017   CHOLHDL 4.0 05/12/2017    Last A1C in the office was:  Lab Results  Component Value Date   HGBA1C 5.3 03/05/2016   Last GFR: Lab Results  Component Value Date   GFRNONAA 65 05/12/2017    Patient is on Vitamin D supplement, 4000 IU.   Lab Results  Component Value Date   VD25OH 51 01/28/2017     Last PSA was: Lab Results  Component Value Date   PSA 1.0 01/28/2017   BMI is Body mass index is 34.39 kg/m., he is working on diet and exercise. Wt Readings from Last 3 Encounters:  02/19/18 253 lb 9.6 oz (115 kg)  07/03/17 240 lb (108.9 kg)  06/25/17 229 lb (103.9 kg)   He has a history of testosterone deficiency, he is not on anything at this time.  Lab Results  Component Value Date   TESTOSTERONE 292 01/28/2017    Current Medications:  Current Outpatient Medications on File Prior to Visit  Medication Sig Dispense Refill  . Adalimumab (HUMIRA PEN) 40 MG/0.4ML PNKT Inject into the skin.    Marland Kitchen ALPRAZolam (XANAX) 0.5 MG tablet Take 1 tablet (0.5 mg total) by mouth 2 (two) times daily as needed for anxiety. 60 tablet 0  . buPROPion (WELLBUTRIN XL) 150 MG 24 hr tablet TAKE 1 TABLET BY MOUTH EVERY MORNING 30 tablet 4  . Cholecalciferol (VITAMIN D3) 5000 units CAPS Take by mouth.    . citalopram (CELEXA) 40 MG tablet TAKE 1 TABLET BY MOUTH EVERY DAY 30 tablet 1   No current facility-administered  medications on file prior to visit.    Allergies:  No Known Allergies Health Maintenance:  Immunization History  Administered Date(s) Administered  . Influenza Inj Mdck Quad With Preservative 02/19/2018  . Influenza,inj,quad, With Preservative 03/05/2016  . Tdap 02/19/2018   Tetanus: 2019 Pneumovax: Prevnar 13: Flu vaccine: 2019 Zostavax:  DEXA: Colonoscopy: EGD:  Patient Care Team: Lucky Cowboy, MD as PCP - General (Internal Medicine)  Medical History:  has Essential hypertension; Prediabetes; Hyperlipidemia; Medication management; Hypogonadism in male; Vitamin D deficiency; Erectile dysfunction; and Depression, major, recurrent, in partial remission (HCC) on their problem list. Surgical History:  He  has a past surgical history that includes Foot fracture surgery (1985). Family History:  His family history includes Cancer in his maternal grandmother; Diabetes in his maternal grandfather and mother; Heart disease in his mother; Hypertension in his mother. Social History:   reports that he quit smoking about 27 years ago. He has never used smokeless tobacco. He reports that he does not drink alcohol or use drugs.   Review of Systems:  Review of Systems  Constitutional: Negative.   HENT: Negative.   Eyes: Negative.   Respiratory: Negative.   Cardiovascular: Negative.   Gastrointestinal: Negative.   Genitourinary: Negative.   Musculoskeletal: Negative.   Skin: Negative.   Neurological: Negative.   Endo/Heme/Allergies: Negative.   Psychiatric/Behavioral: Negative.     Physical Exam: Estimated body mass index is 34.39 kg/m as calculated from the following:   Height as of this encounter: 6' (1.829 m).   Weight as of this encounter: 253 lb 9.6 oz (115 kg). BP 126/84   Pulse 85   Temp 98 F (36.7 C)   Resp 16   Ht 6' (1.829 m)   Wt 253 lb 9.6 oz (115 kg)   SpO2 97%   BMI 34.39 kg/m  General Appearance: Well nourished, in no apparent distress.  Eyes:  PERRLA, EOMs, conjunctiva no swelling or erythema, normal fundi and vessels.  Sinuses: No Frontal/maxillary tenderness  ENT/Mouth: Ext aud canals clear, normal light reflex with TMs without erythema, bulging. Good dentition. No erythema, swelling, or exudate on post pharynx. Tonsils not swollen or erythematous. Hearing normal.  Neck: Supple, thyroid normal. No bruits  Respiratory: Respiratory effort normal, BS equal bilaterally without rales, rhonchi, wheezing or stridor.  Cardio: RRR without murmurs, rubs or gallops. Brisk peripheral pulses without edema.  Chest: symmetric, with normal excursions and percussion.  Abdomen: Soft, nontender, no guarding, rebound, hernias, masses, or organomegaly.  Lymphatics: Non tender without lymphadenopathy.  Genitourinary: defer Musculoskeletal: Full ROM all peripheral extremities,5/5 strength, and normal gait.  Skin: continuing erythematous rash along legs, resolving with back and arms. Warm, dry without rashes, lesions, ecchymosis. Neuro: Cranial nerves intact, reflexes equal bilaterally. Normal muscle tone, no cerebellar symptoms. Sensation intact.  Psych: Awake and oriented X 3, normal affect, Insight and Judgment appropriate.   EKG: WNL no changes. AORTA SCAN: defer  [name] 2:24 PM [facility]

## 2018-02-19 ENCOUNTER — Encounter: Payer: Self-pay | Admitting: Physician Assistant

## 2018-02-19 ENCOUNTER — Ambulatory Visit (INDEPENDENT_AMBULATORY_CARE_PROVIDER_SITE_OTHER): Payer: 59 | Admitting: Physician Assistant

## 2018-02-19 VITALS — BP 126/84 | HR 85 | Temp 98.0°F | Resp 16 | Ht 72.0 in | Wt 253.6 lb

## 2018-02-19 DIAGNOSIS — E785 Hyperlipidemia, unspecified: Secondary | ICD-10-CM | POA: Diagnosis not present

## 2018-02-19 DIAGNOSIS — I10 Essential (primary) hypertension: Secondary | ICD-10-CM

## 2018-02-19 DIAGNOSIS — N529 Male erectile dysfunction, unspecified: Secondary | ICD-10-CM

## 2018-02-19 DIAGNOSIS — Z0000 Encounter for general adult medical examination without abnormal findings: Secondary | ICD-10-CM

## 2018-02-19 DIAGNOSIS — R7303 Prediabetes: Secondary | ICD-10-CM

## 2018-02-19 DIAGNOSIS — Z23 Encounter for immunization: Secondary | ICD-10-CM

## 2018-02-19 DIAGNOSIS — Z79899 Other long term (current) drug therapy: Secondary | ICD-10-CM

## 2018-02-19 DIAGNOSIS — Z136 Encounter for screening for cardiovascular disorders: Secondary | ICD-10-CM

## 2018-02-19 DIAGNOSIS — E291 Testicular hypofunction: Secondary | ICD-10-CM

## 2018-02-19 DIAGNOSIS — F3341 Major depressive disorder, recurrent, in partial remission: Secondary | ICD-10-CM

## 2018-02-19 DIAGNOSIS — E559 Vitamin D deficiency, unspecified: Secondary | ICD-10-CM

## 2018-02-19 NOTE — Patient Instructions (Addendum)
Message if you want to add on wellbutrin for the winter  Check out  Mini habits for weight loss book  2 apps for tracking food is myfitness pal  loseit OR can take picture of your food   Can do zinc 40-50 mg a day Can try shake that is whey protein, almond milk, avocado oil, and spinach and strawberries in the morning  9 Ways to Naturally Increase Testosterone Levels  1.   Lose Weight If you're overweight, shedding the excess pounds may increase your testosterone levels, according to research presented at the Endocrine Society's 2012 meeting. Overweight men are more likely to have low testosterone levels to begin with, so this is an important trick to increase your body's testosterone production when you need it most.  2.   High-Intensity Exercise like Peak Fitness  Short intense exercise has a proven positive effect on increasing testosterone levels and preventing its decline. That's unlike aerobics or prolonged moderate exercise, which have shown to have negative or no effect on testosterone levels. Having a whey protein meal after exercise can further enhance the satiety/testosterone-boosting impact (hunger hormones cause the opposite effect on your testosterone and libido). Here's a summary of what a typical high-intensity Peak Fitness routine might look like: " Warm up for three minutes  " Exercise as hard and fast as you can for 30 seconds. You should feel like you couldn't possibly go on another few seconds  " Recover at a slow to moderate pace for 90 seconds  " Repeat the high intensity exercise and recovery 7 more times .  3.   Consume Plenty of Zinc The mineral zinc is important for testosterone production, and supplementing your diet for as little as six weeks has been shown to cause a marked improvement in testosterone among men with low levels.1 Likewise, research has shown that restricting dietary sources of zinc leads to a significant decrease in testosterone, while zinc  supplementation increases it2 -- and even protects men from exercised-induced reductions in testosterone levels.3 It's estimated that up to 45 percent of adults over the age of 60 may have lower than recommended zinc intakes; even when dietary supplements were added in, an estimated 20-25 percent of older adults still had inadequate zinc intakes, according to a [employer] and Nutrition Examination Survey.4 Your diet is the best source of zinc; along with protein-rich foods like meats and fish, other good dietary sources of zinc include raw milk, raw cheese, beans, and yogurt or kefir made from raw milk. It can be difficult to obtain enough dietary zinc if you're a vegetarian, and also for meat-eaters as well, largely because of conventional farming methods that rely heavily on chemical fertilizers and pesticides. These chemicals deplete the soil of nutrients ... nutrients like zinc that must be absorbed by plants in order to be passed on to you. In many cases, you may further deplete the nutrients in your food by the way you prepare it. For most food, cooking it will drastically reduce its levels of nutrients like zinc ... particularly over-cooking, which many people do. If you decide to use a zinc supplement, stick to a dosage of less than 40 mg a day, as this is the recommended adult upper limit. Taking too much zinc can interfere with your body's ability to absorb other minerals, especially copper, and may cause nausea as a side effect.  4.   Strength Training In addition to Peak Fitness, strength training is also known to boost testosterone levels, provided  you are doing so intensely enough. When strength training to boost testosterone, you'll want to increase the weight and lower your number of reps, and then focus on exercises that work a large number of muscles, such as dead lifts or squats.  You can "turbo-charge" your weight training by going slower. By slowing down your movement, you're  actually turning it into a high-intensity exercise. Super Slow movement allows your muscle, at the microscopic level, to access the maximum number of cross-bridges between the protein filaments that produce movement in the muscle.   5.   Optimize Your Vitamin D Levels Vitamin D, a steroid hormone, is essential for the healthy development of the nucleus of the sperm cell, and helps maintain semen quality and sperm count. Vitamin D also increases levels of testosterone, which may boost libido. In one study, overweight men who were given vitamin D supplements had a significant increase in testosterone levels after one year.5   6.   Reduce Stress When you're under a lot of stress, your body releases high levels of the stress hormone cortisol. This hormone actually blocks the effects of testosterone,6 presumably because, from a biological standpoint, testosterone-associated behaviors (mating, competing, aggression) may have lowered your chances of survival in an emergency (hence, the "fight or flight" response is dominant, courtesy of cortisol).  7.   Limit or Eliminate Sugar from Your Diet Testosterone levels decrease after you eat sugar, which is likely because the sugar leads to a high insulin level, another factor leading to low testosterone.7 Based on USDA estimates, the average American consumes 12 teaspoons of sugar a day, which equates to about TWO TONS of sugar during a lifetime.  8.   Eat Healthy Fats By healthy, this means not only mon- and polyunsaturated fats, like that found in avocadoes and nuts, but also saturated, as these are essential for building testosterone. Research shows that a diet with less than 40 percent of energy as fat (and that mainly from animal sources, i.e. saturated) lead to a decrease in testosterone levels.8 My personal diet is about 60-70 percent healthy fat, and other experts agree that the ideal diet includes somewhere between 50-70 percent fat.  It's important to  understand that your body requires saturated fats from animal and vegetable sources (such as meat, dairy, certain oils, and tropical plants like coconut) for optimal functioning, and if you neglect this important food group in favor of sugar, grains and other starchy carbs, your health and weight are almost guaranteed to suffer. Examples of healthy fats you can eat more of to give your testosterone levels a boost include: Olives and Olive oil  Coconuts and coconut oil Butter made from raw grass-fed organic milk Raw nuts, such as, almonds or pecans Organic pastured egg yolks Avocados Grass-fed meats Palm oil Unheated organic nut oils   9.   Boost Your Intake of Branch Chain Amino Acids (BCAA) from Foods Like Whey Protein Research suggests that BCAAs result in higher testosterone levels, particularly when taken along with resistance training.9 While BCAAs are available in supplement form, you'll find the highest concentrations of BCAAs like leucine in dairy products - especially quality cheeses and whey protein. Even when getting leucine from your natural food supply, it's often wasted or used as a building block instead of an anabolic agent. So to create the correct anabolic environment, you need to boost leucine consumption way beyond mere maintenance levels. That said, keep in mind that using leucine as a free form amino acid can be  highly counterproductive as when free form amino acids are artificially administrated, they rapidly enter your circulation while disrupting insulin function, and impairing your body's glycemic control. Food-based leucine is really the ideal form that can benefit your muscles without side effects.

## 2018-02-20 ENCOUNTER — Other Ambulatory Visit: Payer: Self-pay | Admitting: Internal Medicine

## 2018-02-20 ENCOUNTER — Other Ambulatory Visit: Payer: Self-pay | Admitting: Physician Assistant

## 2018-02-20 LAB — CBC WITH DIFFERENTIAL/PLATELET
BASOS ABS: 49 {cells}/uL (ref 0–200)
Basophils Relative: 1 %
EOS ABS: 98 {cells}/uL (ref 15–500)
Eosinophils Relative: 2 %
HEMATOCRIT: 44.9 % (ref 38.5–50.0)
Hemoglobin: 16.1 g/dL (ref 13.2–17.1)
LYMPHS ABS: 1397 {cells}/uL (ref 850–3900)
MCH: 29.2 pg (ref 27.0–33.0)
MCHC: 35.9 g/dL (ref 32.0–36.0)
MCV: 81.3 fL (ref 80.0–100.0)
MPV: 9.8 fL (ref 7.5–12.5)
Monocytes Relative: 9.2 %
NEUTROS PCT: 59.3 %
Neutro Abs: 2906 cells/uL (ref 1500–7800)
PLATELETS: 257 10*3/uL (ref 140–400)
RBC: 5.52 10*6/uL (ref 4.20–5.80)
RDW: 12.4 % (ref 11.0–15.0)
TOTAL LYMPHOCYTE: 28.5 %
WBC: 4.9 10*3/uL (ref 3.8–10.8)
WBCMIX: 451 {cells}/uL (ref 200–950)

## 2018-02-20 LAB — COMPLETE METABOLIC PANEL WITH GFR
AG Ratio: 2 (calc) (ref 1.0–2.5)
ALBUMIN MSPROF: 4.7 g/dL (ref 3.6–5.1)
ALT: 27 U/L (ref 9–46)
AST: 25 U/L (ref 10–40)
Alkaline phosphatase (APISO): 47 U/L (ref 40–115)
BUN/Creatinine Ratio: 14 (calc) (ref 6–22)
BUN: 19 mg/dL (ref 7–25)
CO2: 28 mmol/L (ref 20–32)
Calcium: 9.6 mg/dL (ref 8.6–10.3)
Chloride: 103 mmol/L (ref 98–110)
Creat: 1.38 mg/dL — ABNORMAL HIGH (ref 0.60–1.35)
GFR, EST AFRICAN AMERICAN: 70 mL/min/{1.73_m2} (ref >=60)
GFR, EST NON AFRICAN AMERICAN: 60 mL/min/{1.73_m2} (ref >=60)
GLOBULIN: 2.4 g/dL (ref 1.9–3.7)
Glucose, Bld: 84 mg/dL (ref 65–99)
POTASSIUM: 4.9 mmol/L (ref 3.5–5.3)
SODIUM: 139 mmol/L (ref 135–146)
TOTAL PROTEIN: 7.1 g/dL (ref 6.1–8.1)
Total Bilirubin: 1.3 mg/dL — ABNORMAL HIGH (ref 0.2–1.2)

## 2018-02-20 LAB — URINALYSIS, ROUTINE W REFLEX MICROSCOPIC
Bilirubin Urine: NEGATIVE
GLUCOSE, UA: NEGATIVE
Hgb urine dipstick: NEGATIVE
Leukocytes, UA: NEGATIVE
Nitrite: NEGATIVE
Protein, ur: NEGATIVE
SPECIFIC GRAVITY, URINE: 1.023 (ref 1.001–1.03)
pH: <=5 (ref 5.0–8.0)

## 2018-02-20 LAB — HEMOGLOBIN A1C
Hgb A1c MFr Bld: 5 % of total Hgb (ref <5.7)
MEAN PLASMA GLUCOSE: 97 (calc)
eAG (mmol/L): 5.4 (calc)

## 2018-02-20 LAB — MICROALBUMIN / CREATININE URINE RATIO
CREATININE, URINE: 235 mg/dL (ref 20–320)
Microalb Creat Ratio: 4 mcg/mg creat (ref <30)
Microalb, Ur: 1 mg/dL

## 2018-02-20 LAB — MAGNESIUM: Magnesium: 2.2 mg/dL (ref 1.5–2.5)

## 2018-02-20 LAB — LIPID PANEL
CHOL/HDL RATIO: 4.7 (calc) (ref <5.0)
Cholesterol: 242 mg/dL — ABNORMAL HIGH (ref <200)
HDL: 51 mg/dL (ref >40)
LDL Cholesterol (Calc): 170 mg/dL (calc) — ABNORMAL HIGH
NON-HDL CHOLESTEROL (CALC): 191 mg/dL — AB (ref <130)
Triglycerides: 92 mg/dL (ref <150)

## 2018-02-20 LAB — VITAMIN D 25 HYDROXY (VIT D DEFICIENCY, FRACTURES): Vit D, 25-Hydroxy: 37 ng/mL (ref 30–100)

## 2018-02-20 LAB — TESTOSTERONE: Testosterone: 405 ng/dL (ref 250–827)

## 2018-02-20 LAB — TSH: TSH: 2.71 mIU/L (ref 0.40–4.50)

## 2018-02-20 MED ORDER — ROSUVASTATIN CALCIUM 10 MG PO TABS: 10.0000 mg | ORAL_TABLET | Freq: Every day | ORAL | 11 refills | Status: DC

## 2018-03-11 ENCOUNTER — Other Ambulatory Visit: Payer: Self-pay

## 2018-03-12 MED ORDER — ALPRAZOLAM 0.5 MG PO TABS: 0.5000 mg | ORAL_TABLET | Freq: Two times a day (BID) | ORAL | 0 refills | Status: DC | PRN

## 2018-03-20 ENCOUNTER — Other Ambulatory Visit: Payer: Self-pay | Admitting: Internal Medicine

## 2018-03-20 ENCOUNTER — Other Ambulatory Visit: Payer: Self-pay | Admitting: Physician Assistant

## 2018-04-28 DIAGNOSIS — Z79899 Other long term (current) drug therapy: Secondary | ICD-10-CM | POA: Diagnosis not present

## 2018-04-28 DIAGNOSIS — L409 Psoriasis, unspecified: Secondary | ICD-10-CM | POA: Diagnosis not present

## 2018-06-12 ENCOUNTER — Encounter: Payer: Self-pay | Admitting: Internal Medicine

## 2018-06-15 ENCOUNTER — Encounter: Payer: Self-pay | Admitting: Internal Medicine

## 2018-06-25 ENCOUNTER — Other Ambulatory Visit: Payer: Self-pay | Admitting: Physician Assistant

## 2018-08-13 NOTE — Progress Notes (Deleted)
Assessment and Plan:  Hypertension:  -off medication with weight loss -monitor blood pressure at home, all if over 140/90 -Continue DASH diet.   -Reminder to go to the ER if any CP, SOB, nausea, dizziness, severe HA, changes vision/speech, left arm numbness and tingling, and jaw pain.  Cholesterol: -Continue diet and exercise.  -Check cholesterol of meds  Vitamin D Def: -check level -continue medications.   Anxiety and Depression -xanax refilled  Hypogonadism - continue replacement therapy, will try testim, check testosterone levels as needed.   Morbid Obesity with co morbidities - long discussion about weight loss, diet, and exercise  Continue diet and meds as discussed. Further disposition pending results of labs. Future Appointments  Date Time Provider Department Center  08/17/2018  9:30 AM [name], PA-C [facility] None  02/23/2019  2:00 PM [name], PA-C [facility] None    HPI 48 y.o. male  presents for 3 month follow up with hypertension, hyperlipidemia, prediabetes and vitamin D.   His blood pressure has been controlled at hom, HAS BEEN OF BENICAR X 1 WEEK, today their BP is  .   He does not workout. He denies chest pain, shortness of breath, dizziness.     He is not on cholesterol medication, HAS STOPPED X 3 MONTHS and denies myalgias. His cholesterol is at goal. The cholesterol last visit was:   Lab Results  Component Value Date   CHOL 242 (H) 02/19/2018   HDL 51 02/19/2018   LDLCALC 170 (H) 02/19/2018   TRIG 92 02/19/2018   CHOLHDL 4.7 02/19/2018     He has been working on diet and exercise for prediabetes, and denies foot ulcerations, hyperglycemia, hypoglycemia , increased appetite, nausea, paresthesia of the feet, polydipsia, polyuria, visual disturbances, vomiting and weight loss. Last A1C in the office was:  Lab Results  Component Value Date   HGBA1C 5.0 02/19/2018   Patient is on Vitamin D supplement.  Lab Results  Component Value Date    VD25OH 37 02/19/2018     He reports that he has some issues with anxiety.  He reports that he rarely uses the xanax.  He is on the wellbutrin and celexa.   BMI is There is no height or weight on file to calculate BMI., he is working on diet and exercise. He has cut back on carbs and portions.  Wt Readings from Last 3 Encounters:  02/19/18 253 lb 9.6 oz (115 kg)  07/03/17 240 lb (108.9 kg)  06/25/17 229 lb (103.9 kg)   He has a history of testosterone deficiency and is on testosterone replacement,  He states that the testosterone helps with his energy, libido, muscle mass. Lab Results  Component Value Date   TESTOSTERONE 405 02/19/2018    Current Medications:  Current Outpatient Medications on File Prior to Visit  Medication Sig Dispense Refill  . Adalimumab (HUMIRA PEN) 40 MG/0.4ML PNKT Inject into the skin.    Marland Kitchen ALPRAZolam (XANAX) 0.5 MG tablet Take 1 tablet (0.5 mg total) by mouth 2 (two) times daily as needed for anxiety. 60 tablet 0  . buPROPion (WELLBUTRIN XL) 150 MG 24 hr tablet TAKE 1 TABLET BY MOUTH EVERY MORNING 90 tablet 0  . Cholecalciferol (VITAMIN D3) 5000 units CAPS Take by mouth.    . citalopram (CELEXA) 40 MG tablet TAKE 1 TABLET BY MOUTH EVERY DAY 90 tablet 0  . rosuvastatin (CRESTOR) 10 MG tablet Take 1 tablet (10 mg total) by mouth at bedtime. 30 tablet 11   No current  facility-administered medications on file prior to visit.     Medical History:  Past Medical History:  Diagnosis Date  . Anxiety   . Depression   . HLD (hyperlipidemia)   . HTN (hypertension)     Allergies: No Known Allergies   Review of Systems:  Review of Systems  Constitutional: Negative for chills, fever and malaise/fatigue.  HENT: Negative for congestion, ear pain and sore throat.   Eyes: Negative.   Respiratory: Negative for cough, shortness of breath and wheezing.   Cardiovascular: Negative for chest pain, palpitations and leg swelling.  Gastrointestinal: Negative for  abdominal pain, blood in stool, constipation, diarrhea, heartburn and melena.  Genitourinary: Negative.   Skin: Negative.   Neurological: Negative for dizziness, sensory change, loss of consciousness and headaches.  Psychiatric/Behavioral: Negative for depression. The patient is not nervous/anxious and does not have insomnia.     Family history- Review and unchanged  Social history- Review and unchanged  Physical Exam: There were no vitals taken for this visit. Wt Readings from Last 3 Encounters:  02/19/18 253 lb 9.6 oz (115 kg)  07/03/17 240 lb (108.9 kg)  06/25/17 229 lb (103.9 kg)    General Appearance: Well nourished well developed, in no apparent distress. Eyes: PERRLA, EOMs, conjunctiva no swelling or erythema ENT/Mouth: Ear canals normal without obstruction, swelling, erythma, discharge.  TMs normal bilaterally.  Oropharynx moist, clear, without exudate, or postoropharyngeal swelling. Neck: Supple, thyroid normal,no cervical adenopathy  Respiratory: Respiratory effort normal, Breath sounds clear A&P without rhonchi, wheeze, or rale.  No retractions, no accessory usage. Cardio: RRR with no MRGs. Brisk peripheral pulses without edema.  Abdomen: Soft, + BS,  Non tender, no guarding, rebound, hernias, masses. Musculoskeletal: Full ROM, 5/5 strength, Normal gait Skin: Warm, dry without rashes, lesions, ecchymosis.  Neuro: Awake and oriented X 3, Cranial nerves intact. Normal muscle tone, no cerebellar symptoms. Psych: Normal affect, Insight and Judgment appropriate.    [name], PA-C 1:42 PM [facility]

## 2018-08-17 ENCOUNTER — Ambulatory Visit: Payer: Self-pay | Admitting: Physician Assistant

## 2018-08-26 ENCOUNTER — Other Ambulatory Visit: Payer: Self-pay | Admitting: Physician Assistant

## 2018-10-10 ENCOUNTER — Other Ambulatory Visit: Payer: Self-pay | Admitting: Adult Health

## 2019-01-15 ENCOUNTER — Other Ambulatory Visit: Payer: Self-pay | Admitting: Physician Assistant

## 2019-02-18 NOTE — Progress Notes (Signed)
Complete Physical  Assessment and Plan:  Screening PSA (prostate specific antigen) -     PSA  BMI 38 -     phentermine (ADIPEX-P) 37.5 MG tablet; Take 1 tablet (37.5 mg total) by mouth daily before breakfast. Will bring back once a month, if he does not do well with phentermine can try vyvanse for binge eating - follow up counselor - bring back food diary.   Encounter for general adult medical examination with abnormal findings 1 year  Essential hypertension - continue medications, DASH diet, exercise and monitor at home. Call if greater than 130/80 - cut benicar in half with weight loss.  -     CBC with Differential/Platelet -     Hepatic function panel -     BASIC METABOLIC PANEL WITH GFR -     TSH -     Urinalysis, Routine w reflex microscopic -     Microalbumin / creatinine urine ratio -     EKG 12-Lead  Hyperlipidemia, unspecified hyperlipidemia type - check lipids off med and with weight loss, decrease fatty foods, increase activity.  -     Lipid panel  Abnormal glucose Discussed general issues about diabetes pathophysiology and management., Educational material distributed., Suggested low cholesterol diet., Encouraged aerobic exercise., Discussed foot care., Reminded to get yearly retinal exam.  Medication management -     Magnesium  Vitamin D deficiency -     VITAMIN D 25 Hydroxy (Vit-D Deficiency, Fractures)  Hypogonadism in male Might be better with weight loss, will recheck -     Testosterone  Erectile dysfunction, unspecified erectile dysfunction type  Depression, major, recurrent, in partial remission (HCC) - continue medications, stress management techniques discussed, increase water, good sleep hygiene discussed, increase exercise, and increase veggies.   Discussed med's effects and SE's. Screening labs and tests as requested with regular follow-up as recommended. Over 40 minutes of exam, counseling, chart review and critical decision making was  performed  Let's ask a few questions about food:   Please check yes or no below.   Yes No  Do you ever over eat or binge eat?  X   Do you find yourself eating when you are sad, happy, frustrated?  X    Do you eat when you are bored? X   Do you eat out often?  X  Do you work out?  X  Do you drink any juices, sweet tea, sugary coffee, or sodas? X    Do you drink any diet soda? X   Do you eat breakfast every day? Works nights States he snacks all day When he is off 2-3 meals and snacks    How much water do you drink in a day? 2L  In your opinion, what is your trouble area with food? Sweets/ice cream  HPI Patient presents for a complete physical.   His blood pressure has been controlled at home, today their BP is BP: 140/76 He does not workout. He denies chest pain, shortness of breath, dizziness.  He was started on humira but states it was not helping so he stopped it. His rash got better.   He has had some depression, wife separated but now she is back, has 2 step kids 24 and 37, and weight gain with it. He is still on the celelxa  BMI is Body mass index is 38.11 kg/m., he is working on diet and exercise. Wt Readings from Last 3 Encounters:  02/23/19 281 lb (127.5 kg)  02/19/18 253  lb 9.6 oz (115 kg)  07/03/17 240 lb (108.9 kg)   He is not on cholesterol medication SUPPOSE TO BE ON CRESTOR BUT HE IS NOT   His cholesterol is not at goal.  Mom with CAD at 32.  6 pack a year smoking hx, quit 1990. The cholesterol last visit was:   Lab Results  Component Value Date   CHOL 242 (H) 02/19/2018   HDL 51 02/19/2018   LDLCALC 170 (H) 02/19/2018   TRIG 92 02/19/2018   CHOLHDL 4.7 02/19/2018    Last A1C in the office was:  Lab Results  Component Value Date   HGBA1C 5.0 02/19/2018   Last GFR: Lab Results  Component Value Date   GFRNONAA 60 02/19/2018    Patient is on Vitamin D supplement, 4000 IU.   Lab Results  Component Value Date   VD25OH 37 02/19/2018      Last PSA was: Lab Results  Component Value Date   PSA 1.0 01/28/2017   BMI is Body mass index is 38.11 kg/m., he is working on diet and exercise. Wt Readings from Last 3 Encounters:  02/23/19 281 lb (127.5 kg)  02/19/18 253 lb 9.6 oz (115 kg)  07/03/17 240 lb (108.9 kg)   He has a history of testosterone deficiency, he is not on anything at this time.  Lab Results  Component Value Date   TESTOSTERONE 405 02/19/2018    Current Medications:  Current Outpatient Medications on File Prior to Visit  Medication Sig Dispense Refill  . ALPRAZolam (XANAX) 0.5 MG tablet TAKE 1 TABLET(0.5 MG) BY MOUTH TWICE DAILY AS NEEDED FOR ANXIETY 60 tablet 0  . buPROPion (WELLBUTRIN XL) 150 MG 24 hr tablet TAKE 1 TABLET BY MOUTH EVERY MORNING 90 tablet 0  . Cholecalciferol (VITAMIN D3) 5000 units CAPS Take by mouth.    . citalopram (CELEXA) 40 MG tablet TAKE 1 TABLET BY MOUTH EVERY DAY 90 tablet 0  . rosuvastatin (CRESTOR) 10 MG tablet Take 1 tablet (10 mg total) by mouth at bedtime. 30 tablet 11   No current facility-administered medications on file prior to visit.    Allergies:  No Known Allergies   Health Maintenance:  Immunization History  Administered Date(s) Administered  . Influenza Inj Mdck Quad With Preservative 02/19/2018  . Influenza,inj,quad, With Preservative 03/05/2016  . Tdap 02/19/2018   Tetanus: 2019 Pneumovax: Prevnar 13: Flu vaccine: 2019 Zostavax:  DEXA: Colonoscopy: DUE 07/2019 EGD: Echo 2011  Patient Care Team: Unk Pinto, MD as PCP - General (Internal Medicine)  Medical History:  has Essential hypertension; Prediabetes; Hyperlipidemia; Medication management; Hypogonadism in male; Vitamin D deficiency; Erectile dysfunction; and Depression, major, recurrent, in partial remission ([place]) on their problem list. Surgical History:  He  has a past surgical history that includes Foot fracture surgery (1985). Family History:  His family history includes Cancer in  his maternal grandmother; Diabetes in his maternal grandfather and mother; Heart disease in his mother; Hypertension in his mother. Social History:   reports that he quit smoking about 28 years ago. He has never used smokeless tobacco. He reports that he does not drink alcohol or use drugs.   Review of Systems:  Review of Systems  Constitutional: Negative.   HENT: Negative.   Eyes: Negative.   Respiratory: Negative.   Cardiovascular: Negative.   Gastrointestinal: Negative.   Genitourinary: Negative.   Musculoskeletal: Negative.   Skin: Negative.   Neurological: Negative.   Endo/Heme/Allergies: Negative.   Psychiatric/Behavioral: Negative.  Physical Exam: Estimated body mass index is 38.11 kg/m as calculated from the following:   Height as of this encounter: 6' (1.829 m).   Weight as of this encounter: 281 lb (127.5 kg). BP 140/76   Pulse 92   Temp (!) 97.3 F (36.3 C)   Ht 6' (1.829 m)   Wt 281 lb (127.5 kg)   SpO2 98%   BMI 38.11 kg/m  General Appearance: Well nourished, in no apparent distress.  Eyes: PERRLA, EOMs, conjunctiva no swelling or erythema, normal fundi and vessels.  Sinuses: No Frontal/maxillary tenderness  ENT/Mouth: Ext aud canals clear, normal light reflex with TMs without erythema, bulging. Good dentition. No erythema, swelling, or exudate on post pharynx. Tonsils not swollen or erythematous. Hearing normal.  Neck: Supple, thyroid normal. No bruits  Respiratory: Respiratory effort normal, BS equal bilaterally without rales, rhonchi, wheezing or stridor.  Cardio: RRR without murmurs, rubs or gallops. Brisk peripheral pulses without edema.  Chest: symmetric, with normal excursions and percussion.  Abdomen: Soft, nontender, no guarding, rebound, hernias, masses, or organomegaly.  Lymphatics: Non tender without lymphadenopathy.  Genitourinary: defer Musculoskeletal: Full ROM all peripheral extremities,5/5 strength, and normal gait.  Skin: continuing  erythematous rash along legs, resolving with back and arms. Warm, dry without rashes, lesions, ecchymosis. Neuro: Cranial nerves intact, reflexes equal bilaterally. Normal muscle tone, no cerebellar symptoms. Sensation intact.  Psych: Awake and oriented X 3, normal affect, Insight and Judgment appropriate.   EKG: WNL no changes. AORTA SCAN: defer  Quentin MullingAmanda Serrena Linderman 1:59 PM [facility]

## 2019-02-23 ENCOUNTER — Ambulatory Visit (INDEPENDENT_AMBULATORY_CARE_PROVIDER_SITE_OTHER): Payer: 59 | Admitting: Physician Assistant

## 2019-02-23 ENCOUNTER — Encounter: Payer: Self-pay | Admitting: Physician Assistant

## 2019-02-23 ENCOUNTER — Other Ambulatory Visit: Payer: Self-pay

## 2019-02-23 VITALS — BP 140/76 | HR 92 | Temp 97.3°F | Ht 72.0 in | Wt 281.0 lb

## 2019-02-23 DIAGNOSIS — Z6838 Body mass index (BMI) 38.0-38.9, adult: Secondary | ICD-10-CM

## 2019-02-23 DIAGNOSIS — I10 Essential (primary) hypertension: Secondary | ICD-10-CM

## 2019-02-23 DIAGNOSIS — Z0000 Encounter for general adult medical examination without abnormal findings: Secondary | ICD-10-CM

## 2019-02-23 DIAGNOSIS — Z136 Encounter for screening for cardiovascular disorders: Secondary | ICD-10-CM

## 2019-02-23 DIAGNOSIS — N529 Male erectile dysfunction, unspecified: Secondary | ICD-10-CM

## 2019-02-23 DIAGNOSIS — E559 Vitamin D deficiency, unspecified: Secondary | ICD-10-CM

## 2019-02-23 DIAGNOSIS — F3341 Major depressive disorder, recurrent, in partial remission: Secondary | ICD-10-CM

## 2019-02-23 DIAGNOSIS — Z79899 Other long term (current) drug therapy: Secondary | ICD-10-CM

## 2019-02-23 DIAGNOSIS — E785 Hyperlipidemia, unspecified: Secondary | ICD-10-CM

## 2019-02-23 DIAGNOSIS — R7309 Other abnormal glucose: Secondary | ICD-10-CM

## 2019-02-23 DIAGNOSIS — Z125 Encounter for screening for malignant neoplasm of prostate: Secondary | ICD-10-CM

## 2019-02-23 DIAGNOSIS — Z0001 Encounter for general adult medical examination with abnormal findings: Secondary | ICD-10-CM

## 2019-02-23 DIAGNOSIS — E291 Testicular hypofunction: Secondary | ICD-10-CM

## 2019-02-23 MED ORDER — PHENTERMINE HCL 37.5 MG PO TABS: 37.5000 mg | ORAL_TABLET | Freq: Every day | ORAL | 0 refills | Status: DC

## 2019-02-23 NOTE — Patient Instructions (Addendum)
Keep food log before you come in a month Stop sugary drinks Start 1/2 of the phentermine for 1 month   Who Qualifies for Obesity Medications? Although everyone is hopeful for a fast and easy way to lose weight, nothing has been shown to replace a prudent, calorie-controlled diet along with behavior modification as a cornerstone for all obesity treatments.   The next tool that can be used to achieve weight-loss and health improvement is medication.   Pharmacotherapy may be offered to Individuals affected by obesity who have failed to achieve weight-loss through diet and exercise alone.  We have decided to try you on a weight loss medication here is some general information about this medication.    PHENTERMINE How does it work? Phentermine is a medication available by prescription that works on chemicals in the brain to decrease your appetite. It also has a mild stimulant component that adds extra energy. Phentermine is a pill that is taken once a day in the morning time.   Tolerance to this medication normally develops, so it can only be used for several months at a time.  Common side effects are dry mouth, sleeplessness, constipation.  Concerns: Due to its stimulant effect, a person's blood pressure and heart rate may increase when on this medication; therefore, you must be monitored closely by a physician who is experienced in prescribing this medication. It cannot be used in patients with some heart conditions (such as poorly controlled blood pressure), glaucoma (increased pressure in your eye), stroke or overactive thyroid. There is some concern for abuse, but this is minimal if the medication is appropriately used as directed by a healthcare professional.   Follow-up Visits: Frequent visits (every 3 to 4 weeks) are encouraged until initial weight-loss goals (5 to 10 percent of body weight) are achieved.   At that point, less frequent visits are typically scheduled as needed for  individual patients. However, since obesity is considered a chronic life-long problem for many individuals, periodic continual follow up is recommended.  Research has shown that weight-loss as low as 5 percent of initial body weight can lead to favorable improvements in blood pressure, cholesterol, glucose levels and insulin sensitivity. The risk of developing heart disease is reduced the most in patients who have impaired glucose tolerance, type 2 diabetes or high blood pressure.   Phentermine  While taking the medication we may ask that you come into the office once a month for the first month for a blood pressure check and EKG.   Also please bring in a food log for that visit to review. It is helpful if you bring in a food diary or use an app on your phone such as myfitnesspal to record your calorie intake, especially in the beginning. BRING FOR YOUR FIRST VISIT.  In addition we can help answer your questions about diet, exercise, and help you every step of the way with your weight loss journey.  You can start out on 1/3 to 1/2 a pill in the morning and if you are tolerating it well you can increase to one pill daily. I also have some patients that take 1/3 or 1/2 at lunch to help prevent night time eating.  This medication is cheapest CASH pay at [facility] is 14-17 dollars and you do NOT need a membership to get meds from there.   It causes dry mouth and constipation in almost every patient, so try to get 80-100 oz of water a day and  increase fiber such as veggies. You can add on a stool softener if you would like.   It can give you energy however it can also cause some people to be shaky, anxious or have palpitations. Stop this medication if that happens and contact the office.   If this medication does not work for you there are several medications that we can try to help rewire your brain in addition to making healthier habits.   What is this medicine? PHENTERMINE  (FEN ter meen) decreases your appetite. This medicine is intended to be used in addition to a healthy reduced calorie diet and exercise. The best results are achieved this way. This medicine is only indicated for short-term use. Eventually your weight loss may level out and the medication will no longer be needed.   How should I use this medicine? Take this medicine by mouth. Follow the directions on the prescription label. The tablets should stay in the bottle until immediately before you take your dose. Take your doses at regular intervals. Do not take your medicine more often than directed.  Overdosage: If you think you have taken too much of this medicine contact a poison control center or emergency room at once. NOTE: This medicine is only for you. Do not share this medicine with others.  What if I miss a dose? If you miss a dose, take it as soon as you can. If it is almost time for your next dose, take only that dose. Do not take double or extra doses. Do not increase or in any way change your dose without consulting your doctor.  What should I watch for while using this medicine? Notify your physician immediately if you become short of breath while doing your normal activities. Do not take this medicine within 6 hours of bedtime. It can keep you from getting to sleep. Avoid drinks that contain caffeine and try to stick to a regular bedtime every night. Do not stand or sit up quickly, especially if you are an older patient. This reduces the risk of dizzy or fainting spells. Avoid alcoholic drinks.  What side effects may I notice from receiving this medicine? Side effects that you should report to your doctor or health care professional as soon as possible: -chest pain, palpitations -depression or severe changes in mood -increased blood pressure -irritability -nervousness or restlessness -severe dizziness -shortness of breath -problems urinating -unusual swelling of the legs -vomiting   Side effects that usually do not require medical attention (report to your doctor or health care professional if they continue or are bothersome): -blurred vision or other eye problems -changes in sexual ability or desire -constipation or diarrhea -difficulty sleeping -dry mouth or unpleasant taste -headache -nausea This list may not describe all possible side effects. Call your doctor for medical advice about side effects. You may report side effects to FDA at 1-800-FDA-1088.

## 2019-02-24 LAB — COMPLETE METABOLIC PANEL WITH GFR
AG Ratio: 1.9 (calc) (ref 1.0–2.5)
ALT: 32 U/L (ref 9–46)
AST: 23 U/L (ref 10–40)
Albumin: 4.6 g/dL (ref 3.6–5.1)
Alkaline phosphatase (APISO): 62 U/L (ref 36–130)
BUN/Creatinine Ratio: 11 (calc) (ref 6–22)
BUN: 15 mg/dL (ref 7–25)
CO2: 25 mmol/L (ref 20–32)
Calcium: 9.7 mg/dL (ref 8.6–10.3)
Chloride: 102 mmol/L (ref 98–110)
Creat: 1.39 mg/dL — ABNORMAL HIGH (ref 0.60–1.35)
GFR, Est African American: 68 mL/min/{1.73_m2} (ref >=60)
GFR, Est Non African American: 59 mL/min/{1.73_m2} — ABNORMAL LOW (ref >=60)
Globulin: 2.4 g/dL (calc) (ref 1.9–3.7)
Glucose, Bld: 95 mg/dL (ref 65–99)
Potassium: 4.9 mmol/L (ref 3.5–5.3)
Sodium: 138 mmol/L (ref 135–146)
Total Bilirubin: 0.7 mg/dL (ref 0.2–1.2)
Total Protein: 7 g/dL (ref 6.1–8.1)

## 2019-02-24 LAB — CBC WITH DIFFERENTIAL/PLATELET
Absolute Monocytes: 599 cells/uL (ref 200–950)
Basophils Absolute: 59 cells/uL (ref 0–200)
Basophils Relative: 0.8 %
Eosinophils Absolute: 289 cells/uL (ref 15–500)
Eosinophils Relative: 3.9 %
HCT: 46.5 % (ref 38.5–50.0)
Hemoglobin: 16.5 g/dL (ref 13.2–17.1)
Lymphs Abs: 2079 cells/uL (ref 850–3900)
MCH: 29.4 pg (ref 27.0–33.0)
MCHC: 35.5 g/dL (ref 32.0–36.0)
MCV: 82.7 fL (ref 80.0–100.0)
MPV: 9.9 fL (ref 7.5–12.5)
Monocytes Relative: 8.1 %
Neutro Abs: 4373 cells/uL (ref 1500–7800)
Neutrophils Relative %: 59.1 %
Platelets: 287 10*3/uL (ref 140–400)
RBC: 5.62 10*6/uL (ref 4.20–5.80)
RDW: 12.9 % (ref 11.0–15.0)
Total Lymphocyte: 28.1 %
WBC: 7.4 10*3/uL (ref 3.8–10.8)

## 2019-02-24 LAB — URINALYSIS, ROUTINE W REFLEX MICROSCOPIC
Bilirubin Urine: NEGATIVE
Glucose, UA: NEGATIVE
Hgb urine dipstick: NEGATIVE
Ketones, ur: NEGATIVE
Leukocytes,Ua: NEGATIVE
Nitrite: NEGATIVE
Protein, ur: NEGATIVE
Specific Gravity, Urine: 1.012 (ref 1.001–1.03)
pH: <=5 (ref 5.0–8.0)

## 2019-02-24 LAB — LIPID PANEL
Cholesterol: 218 mg/dL — ABNORMAL HIGH (ref <200)
HDL: 42 mg/dL (ref >=40)
LDL Cholesterol (Calc): 136 mg/dL (calc) — ABNORMAL HIGH
Non-HDL Cholesterol (Calc): 176 mg/dL (calc) — ABNORMAL HIGH (ref <130)
Total CHOL/HDL Ratio: 5.2 (calc) — ABNORMAL HIGH (ref <5.0)
Triglycerides: 246 mg/dL — ABNORMAL HIGH (ref <150)

## 2019-02-24 LAB — TSH: TSH: 3.57 mIU/L (ref 0.40–4.50)

## 2019-02-24 LAB — PSA: PSA: 0.6 ng/mL (ref <=4.0)

## 2019-02-24 LAB — VITAMIN D 25 HYDROXY (VIT D DEFICIENCY, FRACTURES): Vit D, 25-Hydroxy: 32 ng/mL (ref 30–100)

## 2019-02-24 LAB — MICROALBUMIN / CREATININE URINE RATIO
Creatinine, Urine: 96 mg/dL (ref 20–320)
Microalb Creat Ratio: 2 mcg/mg creat (ref <30)
Microalb, Ur: 0.2 mg/dL

## 2019-02-24 LAB — HEMOGLOBIN A1C
Hgb A1c MFr Bld: 5.4 % of total Hgb (ref <5.7)
Mean Plasma Glucose: 108 (calc)
eAG (mmol/L): 6 (calc)

## 2019-02-24 LAB — MAGNESIUM: Magnesium: 1.9 mg/dL (ref 1.5–2.5)

## 2019-02-24 LAB — TESTOSTERONE: Testosterone: 275 ng/dL (ref 250–827)

## 2019-03-22 NOTE — Progress Notes (Signed)
Weight Management Follow Up  Assessment: Diagnoses and all orders for this visit:  Essential hypertension Continue current medications: Monitor blood pressure at home; call if consistently over 130/80 Continue DASH diet.   Reminder to go to the ER if any CP, SOB, nausea, dizziness, severe HA, changes vision/speech, left arm numbness and tingling and jaw pain.  Severe obesity (BMI 35.0-39.9) with comorbidity ([place]) Obesity with co morbidities Long discussion about weight loss, diet, and exercise Discussed final goal weight (below 180lbs) and current weight loss goal (255lbs) Patient will work on decreasing diet sodas, currently one a day. Increase activity, even walking. Patient on phentermine with benefit and no SE, taking drug breaks; continue close follow up. Return in one month for follow up.   Depression, major, recurrent, in partial remission ([place]) Continue medications: Wellbutrin XL 150mg  & Celexa Discussed stress management techniques  Discussed, increase water,intake & good sleep hygiene  Discussed increasing exercise & vegetables in diet Discussed importance of doing something for himself, walking activity he enjoys to help with mental and physical health.  Plan: General weight loss/lifestyle modification strategies discussed (elicit support from others; identify saboteurs; non-food rewards, etc). Continue food diary Continue restricted calorie diet Continue daily exercise as well as behavior modification such as walking further away, putting down the fork, having a plan, using stairs, etc.  Medication: phentermine 37.5mg  Follow up in one month    Future Appointments  Date Time Provider [place]  03/23/2019  2:00 PM Garnet Sierras, NP [facility] None  02/23/2020  2:00 PM Vicie Mutters, PA-C [facility] None    Bernard Thompson is a 48 y.o.male presents for a follow up after being on phentermine for weight loss.  While on the medication they have lost 16lbs  since last visit. He denies palpitations, anxiety, trouble sleeping, elevated BP.   BP Readings from Last 3 Encounters:  03/23/19 120/88  02/23/19 140/76  02/19/18 126/84    An EKG was obtained on 02/23/19.  BMI is Body mass index is 35.94 kg/m., he is working on diet and has not chaged his activity level at this point. Wt Readings from Last 3 Encounters:  03/23/19 265 lb (120.2 kg)  02/23/19 281 lb (127.5 kg)  02/19/18 253 lb 9.6 oz (115 kg)   He works night shift. Typical breakfast: 2:30pm Grilled chicken salad, shredded cheese olives, oilive oil dressing.  Typical lunch: 6-7pm celery, cucumbers, chicken  Typical dinner: midnight chicken with some veggetables  Bedtime 6-7am  He is mainly drinking water, 2-3liters a night and coffee (240z) sweetener, coffeemate creamer, and one can of coke zero daily.    Medications: Current Outpatient Medications on File Prior to Visit  Medication Sig Dispense Refill  . ALPRAZolam (XANAX) 0.5 MG tablet TAKE 1 TABLET(0.5 MG) BY MOUTH TWICE DAILY AS NEEDED FOR ANXIETY 60 tablet 0  . buPROPion (WELLBUTRIN XL) 150 MG 24 hr tablet TAKE 1 TABLET BY MOUTH EVERY MORNING 90 tablet 0  . Cholecalciferol (VITAMIN D3) 5000 units CAPS Take by mouth.    . citalopram (CELEXA) 40 MG tablet TAKE 1 TABLET BY MOUTH EVERY DAY 90 tablet 0  . phentermine (ADIPEX-P) 37.5 MG tablet Take 1 tablet (37.5 mg total) by mouth daily before breakfast. 30 tablet 0   No current facility-administered medications on file prior to visit.     ROS: All negative except for above  Physical exam: Vitals:   03/23/19 1406  BP: 120/88  Pulse: 86  Temp: (!) 97.3 F (36.3 C)  SpO2: 96%  Physical Exam Constitutional:      Appearance: Normal appearance. He is obese.  Cardiovascular:     Rate and Rhythm: Normal rate and regular rhythm.     Pulses: Normal pulses.  Skin:    General: Skin is warm and dry.     Capillary Refill: Capillary refill takes less than 2 seconds.   Neurological:     General: No focal deficit present.     Mental Status: He is alert and oriented to person, place, and time. Mental status is at baseline.  Psychiatric:        Behavior: Behavior normal.        Thought Content: Thought content normal.        Judgment: Judgment normal.      Elder Negus, NP [facility] 03/23/2019  2:24 PM

## 2019-03-23 ENCOUNTER — Encounter: Payer: Self-pay | Admitting: Adult Health Nurse Practitioner

## 2019-03-23 ENCOUNTER — Other Ambulatory Visit: Payer: Self-pay

## 2019-03-23 ENCOUNTER — Ambulatory Visit (INDEPENDENT_AMBULATORY_CARE_PROVIDER_SITE_OTHER): Payer: 59 | Admitting: Adult Health Nurse Practitioner

## 2019-03-23 VITALS — BP 120/88 | HR 86 | Temp 97.3°F | Ht 72.0 in | Wt 265.0 lb

## 2019-03-23 DIAGNOSIS — F3341 Major depressive disorder, recurrent, in partial remission: Secondary | ICD-10-CM | POA: Diagnosis not present

## 2019-03-23 DIAGNOSIS — I10 Essential (primary) hypertension: Secondary | ICD-10-CM

## 2019-03-23 MED ORDER — PHENTERMINE HCL 37.5 MG PO TABS: 37.5000 mg | ORAL_TABLET | Freq: Every day | ORAL | 0 refills | Status: DC

## 2019-03-23 NOTE — Patient Instructions (Signed)
Keep up the good with your diet!  Continue your water intake.  Continue same dose of Phenetamine 37.5mg    Consider adding in walking daily, even can help your physical and mental health.  Choose and activity that you enjoy, reading, going for walk, watching movie, reading about a new topic and consider involving your spouse.  Follow up in one month.     8 Critical Weight-Loss Tips That Aren't Diet and Exercise  1. STARVE THE DISTRACTIONS  All too often when we eat, we're also multitasking: watching TV, answering emails, scrolling through social media. These habits are detrimental to having a strong, clear, healthy relationship with food, and they can hinder our ability to make dietary changes.  In order to truly focus on what you're eating, how much you're eating, why you're eating those specific foods and, most importantly, how those foods make you feel, you need to starve the distractions. That means when you eat, just eat. Focus on your food, the process it went through to end up on your plate, where it came from and how it nourishes you. With this technique, you're more likely to finish a meal feeling satiated.  2.  CONSIDER WHAT YOU'RE NOT WILLING TO DO  This might sound counterintuitive, but it can help provide a "why" when motivation is waning. Declare, in writing, what you are unwilling to do, for example "I am unwilling to be the old dad who cannot play sports with my children".  So consider what you're not willing to accept, write it down, and keep it at the ready.  3.  STOP LABELING FOOD "GOOD" AND "BAD"  You've probably heard someone say they ate something "bad." Maybe you've even said it yourself.  The trouble with 'bad' foods isn't that they'll send you to the grave after a bite or two. The trouble comes when we eat excessive portions of really calorie-dense foods meal after meal, day after day.  Instead of labeling foods as good or bad, think about which foods  you can eat a lot of, and which ones you should just eat a little of. Then, plan ways to eat the foods you really like in portions that fit with your overall goals. A good example of this would be having a slice of pizza alongside a club salad with chicken breast, avocado and a bit of dressing. This is vastly different than 3 slices of pizza, 4 breadsticks with cheese sauce and half of a liter of regular soda.  4.  BRUSH YOUR TEETH AFTER YOU EAT  Getting your mindset in order is important, but sometimes small habits can make a big difference. After eating, you still have the taste of food in their mouth, which often causes people to eat more even if they are full or engage in a nibble or two of dessert.  Brushing your teeth will remove the taste of food from your mouth, and the clean, minty freshness will serve as a cue that mealtime is over.  5.  FOCUS ON CROWDING NOT CUTTING  The most common first step during 'dieting' is to cut. We cut our portion sizes down, we cut out 'bad' foods, we cut out entire food groups. This act of cutting puts [place] and our minds into scarcity mode.  When something is off-limits, even if you're able to avoid it for a while, you could end up bingeing on it later because you've gone so long without it. So, instead of cutting, focus on crowding.  If you crowd your plate and fill it up with more foods like veggies and protein, it simply allows less room for the other stuff. In other words, shift your focus away from what you can't eat, and celebrate the foods that will help you reach your goals.  6.  TAKE TRACKING A STEP FURTHER  Track what you eat, when you ate it, how much you ate and how that food made you feel. Being completely honest with yourself and writing down every single thing that passes through your lips will help you start to notice that maybe you actually do snack, possibly take in more sugar than you thought, eat when you're bored rather than just hungry or maybe  that you have a habit of snacking before bed while watching TV.  The difference from simply tracking your food intake is you're taking into account how food makes you feel, as well as what you're doing while you're eating. This is about becoming more mindful of what, when and why you eat.  7.  PRIORITIZE GOOD SLEEP  One of the strongest risk factors for being overweight is poor sleep. When you're feeling tired, you're more likely to choose unhealthy comfort foods and to skip your workout. Additionally, sleep deprivation may slow down your metabolism. Burnett Kanaris! Therefore, sleeping 7-8 hours per night can help with weight loss without having to change your diet or increase your physical activity. And if you feel you snore and still wake up tired, talk with me about sleep apnea.  8.  SET ASIDE TIME TO DISCONNECT  Just get out there. Disconnect from the electronics and connect to the elements. Not only will this help reduce stress (a major factor in weight gain) by giving your mind a break from the constant stimulation we've all become so accustomed to, but it may also reprogram your brain to connect with yourself and what you're feeling.  Ways to cut 100 calories  1. Eat your eggs with hot sauce OR salsa instead of cheese.  Eggs are great for breakfast, but many people consider eggs and cheese to be BFFs. Instead of cheese-1 oz. of cheddar has 114 calories-top your eggs with hot sauce, which contains no calories and helps with satiety and metabolism. Salsa is also a great option!!  2. Top your toast, waffles or pancakes with mashed berries instead of jelly or syrup. Half a cup of berries-fresh, frozen or thawed-has about 40 calories, compared with 2 tbsp. of maple syrup or jelly, which both have about 100 calories. The berries will also give you a good punch of fiber, which helps keep you full and satisfied and won't spike blood sugar quickly like the jelly or syrup. 3. Swap the non-fat latte for black  coffee with a splash of half-and-half. Contrary to its name, that non-fat latte has 130 calories and a startling 19g of carbohydrates per 16 oz. serving. Replacing that 'light' drinkable dessert with a black coffee with a splash of half-and-half saves you more than 100 calories per 16 oz. serving. 4. Sprinkle salads with freeze-dried raspberries instead of dried cranberries. If you want a sweet addition to your nutritious salad, stay away from dried cranberries. They have a whopping 130 calories per  cup and 30g carbohydrates. Instead, sprinkle freeze-dried raspberries guilt-free and save more than 100 calories per  cup serving, adding 3g of belly-filling fiber. 5. Go for mustard in place of mayo on your sandwich. Mustard can add really nice flavor to any sandwich, and there are  tons of varieties, from spicy to honey. A serving of mayo is 95 calories, versus 10 calories in a serving of mustard. 6. Choose a DIY salad dressing instead of the store-bought kind. Mix Dijon or whole grain mustard with low-fat Kefir or red wine vinegar and garlic. 7. Use hummus as a spread instead of a dip. Use hummus as a spread on a high-fiber cracker or tortilla with a sandwich and save on calories without sacrificing taste. 8. Pick just one salad "accessory." Salad isn't automatically a calorie winner. It's easy to over-accessorize with toppings. Instead of topping your salad with nuts, avocado and cranberries (all three will clock in at 313 calories), just pick one. The next day, choose a different accessory, which will also keep your salad interesting. You don't wear all your jewelry every day, right? 9. Ditch the white pasta in favor of spaghetti squash. One cup of cooked spaghetti squash has about 40 calories, compared with traditional spaghetti, which comes with more than 200. Spaghetti squash is also nutrient-dense. It's a good source of fiber and Vitamins A and C, and it can be eaten just like you would eat  pasta-with a great tomato sauce and [place] meatballs or with pesto, tofu and spinach, for example. 10. Dress up your chili, soups and stews with non-fat [place] yogurt instead of sour cream. Just a 'dollop' of sour cream can set you back 115 calories and a whopping 12g of fat-seven of which are of the artery-clogging variety. Added bonus: [place] yogurt is packed with muscle-building protein, calcium and B Vitamins. 11. Mash cauliflower instead of mashed potatoes. One cup of traditional mashed potatoes-in all their creamy goodness-has more than 200 calories, compared to mashed cauliflower, which you can typically eat for less than 100 calories per 1 cup serving. Cauliflower is a great source of the antioxidant indole-3-carbinol (I3C), which may help reduce the risk of some cancers, like breast cancer. 12. Ditch the ice cream sundae in favor of a [place] yogurt parfait. Instead of a cup of ice cream or fro-yo for dessert, try 1 cup of nonfat Greek yogurt topped with fresh berries and a sprinkle of cacao nibs. Both toppings are packed with antioxidants, which can help reduce cellular inflammation and oxidative damage. And the comparison is a no-brainer: One cup of ice cream has about 275 calories; one cup of frozen yogurt has about 230; and a cup of Greek yogurt has just 130, plus twice the protein, so you're less likely to return to the freezer for a second helping. 13. Put olive oil in a spray container instead of using it directly from the bottle. Each tablespoon of olive oil is 120 calories and 15g of fat. Use a mister instead of pouring it straight into the pan or onto a salad. This allows for portion control and will save you more than 100 calories. 14. When baking, substitute canned pumpkin for butter or oil. Canned pumpkin-not pumpkin pie mix-is loaded with Vitamin A, which is important for skin and eye health, as well as immunity. And the comparisons are pretty crazy:  cup of canned pumpkin has about 40  calories, compared to butter or oil, which has more than 800 calories. Yes, 800 calories. Applesauce and mashed banana can also serve as good substitutions for butter or oil, usually in a 1:1 ratio. 15. Top casseroles with high-fiber cereal instead of breadcrumbs. Breadcrumbs are typically made with white bread, while breakfast cereals contain 5-9g of fiber per serving. Not only will you save  more than 150 calories per  cup serving, the swap will also keep you more full and you'll get a metabolism boost from the added fiber. 16. Snack on pistachios instead of macadamia nuts. Believe it or not, you get the same amount of calories from 35 pistachios (100 calories) as you would from only five macadamia nuts. 17. Chow down on kale chips rather than potato chips. This is my favorite 'don't knock it 'till you try it' swap. Kale chips are so easy to make at home, and you can spice them up with a little grated parmesan or chili powder. Plus, they're a mere fraction of the calories of potato chips, but with the same crunch factor we crave so often. 18. Add seltzer and some fruit slices to your cocktail instead of soda or fruit juice. One cup of soda or fruit juice can pack on as much as 140 calories. Instead, use seltzer and fruit slices. The fruit provides valuable phytochemicals, such as flavonoids and anthocyanins, which help to combat cancer and stave off the aging process.

## 2019-04-11 ENCOUNTER — Other Ambulatory Visit: Payer: Self-pay | Admitting: Adult Health

## 2019-04-13 DIAGNOSIS — N529 Male erectile dysfunction, unspecified: Secondary | ICD-10-CM

## 2019-04-22 ENCOUNTER — Other Ambulatory Visit: Payer: Self-pay | Admitting: Adult Health Nurse Practitioner

## 2019-04-26 NOTE — Progress Notes (Signed)
Weight Management Follow Up  Assessment: Diagnoses and all orders for this visit:  Encounter for Weight Management Obesity (BMI 30.0-34.9) Long discussion about weight loss, diet, and INCREASING exercise Discussed final goal weight (below 180lbs) and current weight loss goal (249lbs) Patient will work on decreasing diet sodas, currently one a day. Increase activity, even walking. Patient on phentermine with benefit and no SE, taking drug breaks; continue close follow up. Return in one month for follow up.   Essential hypertension Continue current medications: Monitor blood pressure at home; call if consistently over 130/80 Continue DASH diet.   Reminder to go to the ER if any CP, SOB, nausea, dizziness, severe HA, changes vision/speech, left arm numbness and tingling and jaw pain.   Depression, major, recurrent, in partial remission ([place]) Continue medications: Wellbutrin XL 150mg  & Celexa Discussed stress management techniques  Discussed, increase water,intake & good sleep hygiene  Discussed increasing exercise & vegetables in diet Discussed importance of doing something for himself, walking activity he enjoys to help with mental and physical health. Has not made this change Recommended including spouse in exercise/walking to encourage communication   Plan: General weight loss/lifestyle modification strategies discussed (elicit support from others; identify saboteurs; non-food rewards, etc). Continue food diary Continue restricted calorie diet Continue daily exercise as well as behavior modification such as walking further away, putting down the fork, having a plan, using stairs, etc.  Medication: phentermine 37.5mg  Follow up in one month    Future Appointments  Date Time Provider [place]  03/23/2019  2:00 PM Garnet Sierras, NP [facility] None  02/23/2020  2:00 PM Vicie Mutters, PA-C [facility] None    Bernard Thompson is a 48 y.o.male presents for a follow up  after being on phentermine for weight loss.  While on the medication they have lost 16lbs since last visit. He denies palpitations, anxiety, trouble sleeping, elevated BP.   He has history of depression and is currently taking Wellbutrin XL 150mg  and celexa.  Last appointment he described worsening depression symptoms.  We discussed adding activities/hobby and/or making time for himself.  Reports he feels like he is just living so everyone else can benefit from his hard work.  He reports that he did not make any lifestyle changes since last visit.  He reports he wants to but just isn't sure what to do.  He denies any mental or physical abuse and denies any SI/HI.  He reports he has seen a pyschiatrist in the past but feels like that did not help him.  He reports he has not participated in marriage counseling. BP Readings from Last 3 Encounters:  03/23/19 120/88  02/23/19 140/76  02/19/18 126/84    An EKG was obtained on 02/23/19.  BMI is There is no height or weight on file to calculate BMI., he is working on diet and has not chaged his activity level at this point. Wt Readings from Last 3 Encounters:  03/23/19 265 lb (120.2 kg)  02/23/19 281 lb (127.5 kg)  02/19/18 253 lb 9.6 oz (115 kg)   He works night shift.  No changes to his typical diet from last office appointment.  Typical breakfast: 2:30pm Grilled chicken salad, shredded cheese olives, oilive oil dressing.  Typical lunch: 6-7pm celery, cucumbers or some vegetable and lean meat, [place] or chicken.  Typical dinner: midnight chicken with some vegetables  Bedtime 6-7am  He has continue to stay hydrated. He is mainly drinking water, 2-3liters a night and coffee (240z) sweetener, coffeemate creamer, and one  can of coke zero daily.    Medications: Current Outpatient Medications on File Prior to Visit  Medication Sig Dispense Refill  . ALPRAZolam (XANAX) 0.5 MG tablet TAKE 1 TABLET(0.5 MG) BY MOUTH TWICE DAILY AS NEEDED FOR ANXIETY  60 tablet 0  . buPROPion (WELLBUTRIN XL) 150 MG 24 hr tablet Take 1 tablet Daily for Mood, Focus & Concentration 90 tablet 0  . Cholecalciferol (VITAMIN D3) 5000 units CAPS Take by mouth.    . citalopram (CELEXA) 40 MG tablet Take 1 tablet Daily for Mood 90 tablet 0  . phentermine (ADIPEX-P) 37.5 MG tablet TAKE 1 TABLET(37.5 MG) BY MOUTH DAILY BEFORE BREAKFAST 30 tablet 0   No current facility-administered medications on file prior to visit.     ROS: All negative except for above  Physical exam: There were no vitals filed for this visit. Physical Exam Constitutional:      Appearance: Normal appearance. He is obese.  Cardiovascular:     Rate and Rhythm: Normal rate and regular rhythm.     Pulses: Normal pulses.  Skin:    General: Skin is warm and dry.     Capillary Refill: Capillary refill takes less than 2 seconds.  Neurological:     General: No focal deficit present.     Mental Status: He is alert and oriented to person, place, and time. Mental status is at baseline.  Psychiatric:        Behavior: Behavior normal.        Thought Content: Thought content normal.        Judgment: Judgment normal.      Elder Negus, NP [facility] 04/27/2019  3:30 PM

## 2019-04-27 ENCOUNTER — Other Ambulatory Visit: Payer: Self-pay

## 2019-04-27 ENCOUNTER — Encounter: Payer: Self-pay | Admitting: Adult Health Nurse Practitioner

## 2019-04-27 ENCOUNTER — Ambulatory Visit (INDEPENDENT_AMBULATORY_CARE_PROVIDER_SITE_OTHER): Payer: 59 | Admitting: Adult Health Nurse Practitioner

## 2019-04-27 VITALS — BP 120/88 | HR 71 | Temp 97.6°F | Wt 249.0 lb

## 2019-04-27 DIAGNOSIS — F3341 Major depressive disorder, recurrent, in partial remission: Secondary | ICD-10-CM | POA: Diagnosis not present

## 2019-04-27 DIAGNOSIS — E66811 Obesity, class 1: Secondary | ICD-10-CM

## 2019-04-27 DIAGNOSIS — I10 Essential (primary) hypertension: Secondary | ICD-10-CM

## 2019-04-27 DIAGNOSIS — E669 Obesity, unspecified: Secondary | ICD-10-CM | POA: Diagnosis not present

## 2019-04-27 DIAGNOSIS — Z7689 Persons encountering health services in other specified circumstances: Secondary | ICD-10-CM

## 2019-05-08 ENCOUNTER — Other Ambulatory Visit: Payer: Self-pay | Admitting: Adult Health

## 2019-05-26 ENCOUNTER — Other Ambulatory Visit: Payer: Self-pay | Admitting: Physician Assistant

## 2019-06-01 ENCOUNTER — Ambulatory Visit: Payer: 59 | Admitting: Adult Health Nurse Practitioner

## 2019-06-01 DIAGNOSIS — I10 Essential (primary) hypertension: Secondary | ICD-10-CM

## 2019-06-01 NOTE — Progress Notes (Deleted)
Weight Management Follow Up  Assessment: Diagnoses and all orders for this visit:  Encounter for Weight Management Obesity (BMI 30.0-34.9) Long discussion about weight loss, diet, and INCREASING exercise Discussed final goal weight (below 180lbs) and current weight loss goal (249lbs) Patient will work on decreasing diet sodas, currently one a day. Increase activity, even walking. Patient on phentermine with benefit and no SE, taking drug breaks; continue close follow up. Return in one month for follow up.   Essential hypertension Continue current medications: Monitor blood pressure at home; call if consistently over 130/80 Continue DASH diet.   Reminder to go to the ER if any CP, SOB, nausea, dizziness, severe HA, changes vision/speech, left arm numbness and tingling and jaw pain.   Depression, major, recurrent, in partial remission ([facility]) Continue medications: Wellbutrin XL 150mg  & Celexa Discussed stress management techniques  Discussed, increase water,intake & good sleep hygiene  Discussed increasing exercise & vegetables in diet Discussed importance of doing something for himself, walking activity he enjoys to help with mental and physical health. Has not made this change Recommended including spouse in exercise/walking to encourage communication   Plan: General weight loss/lifestyle modification strategies discussed (elicit support from others; identify saboteurs; non-food rewards, etc). Continue food diary Continue restricted calorie diet Continue daily exercise as well as behavior modification such as walking further away, putting down the fork, having a plan, using stairs, etc.  Medication: phentermine 37.5mg  Follow up in one month    Future Appointments  Date Time Provider Department Center  03/23/2019  2:00 PM 03/25/2019, NP [facility] None  02/23/2020  2:00 PM 02/25/2020, PA-C [facility] None    Bernard Thompson is a 48 y.o.male presents for a follow up  after being on phentermine for weight loss.  While on the medication they have lost 16lbs since last visit. He denies palpitations, anxiety, trouble sleeping, elevated BP.   He has history of depression and is currently taking Wellbutrin XL 150mg  and celexa.  Last appointment he described worsening depression symptoms.  We discussed adding activities/hobby and/or making time for himself.  Reports he feels like he is just living so everyone else can benefit from his hard work.  He reports that he did not make any lifestyle changes since last visit.  He reports he wants to but just isn't sure what to do.  He denies any mental or physical abuse and denies any SI/HI.  He reports he has seen a pyschiatrist in the past but feels like that did not help him.  He reports he has not participated in marriage counseling. BP Readings from Last 3 Encounters:  04/27/19 120/88  03/23/19 120/88  02/23/19 140/76    An EKG was obtained on 02/23/19.  BMI is There is no height or weight on file to calculate BMI., he is working on diet and has not chaged his activity level at this point. Wt Readings from Last 3 Encounters:  04/27/19 249 lb (112.9 kg)  03/23/19 265 lb (120.2 kg)  02/23/19 281 lb (127.5 kg)   He works night shift.  No changes to his typical diet from last office appointment.  Typical breakfast: 2:30pm Grilled chicken salad, shredded cheese olives, oilive oil dressing.  Typical lunch: 6-7pm celery, cucumbers or some vegetable and lean meat, 03/25/19 or chicken.  Typical dinner: midnight chicken with some vegetables  Bedtime 6-7am  He has continue to stay hydrated. He is mainly drinking water, 2-3liters a night and coffee (240z) sweetener, coffeemate creamer, and one can of  coke zero daily.    Medications: Current Outpatient Medications on File Prior to Visit  Medication Sig Dispense Refill  . ALPRAZolam (XANAX) 0.5 MG tablet Take 1/2-1 tablet 1 - 2 x /day ONLY if needed for Panic or Anxiety  Attack &  limit to 5 days /week to avoid addiction 60 tablet 0  . buPROPion (WELLBUTRIN XL) 150 MG 24 hr tablet Take 1 tablet Daily for Mood, Focus & Concentration 90 tablet 0  . Cholecalciferol (VITAMIN D3) 5000 units CAPS Take by mouth.    . citalopram (CELEXA) 40 MG tablet Take 1 tablet Daily for Mood 90 tablet 0  . phentermine (ADIPEX-P) 37.5 MG tablet TAKE 1 TABLET(37.5 MG) BY MOUTH DAILY BEFORE BREAKFAST 30 tablet 0   No current facility-administered medications on file prior to visit.    ROS: All negative except for above  Physical exam: There were no vitals filed for this visit. Physical Exam Constitutional:      Appearance: Normal appearance. He is obese.  Cardiovascular:     Rate and Rhythm: Normal rate and regular rhythm.     Pulses: Normal pulses.  Skin:    General: Skin is warm and dry.     Capillary Refill: Capillary refill takes less than 2 seconds.  Neurological:     General: No focal deficit present.     Mental Status: He is alert and oriented to person, place, and time. Mental status is at baseline.  Psychiatric:        Behavior: Behavior normal.        Thought Content: Thought content normal.        Judgment: Judgment normal.      Garnet Sierras, NP [facility] 04/27/2019  3:30 PM

## 2019-06-15 ENCOUNTER — Other Ambulatory Visit: Payer: Self-pay

## 2019-06-15 ENCOUNTER — Encounter: Payer: Self-pay | Admitting: Adult Health Nurse Practitioner

## 2019-06-15 ENCOUNTER — Ambulatory Visit: Payer: 59 | Admitting: Adult Health Nurse Practitioner

## 2019-06-15 VITALS — BP 128/88 | HR 79 | Temp 97.3°F | Ht 72.0 in | Wt 253.0 lb

## 2019-06-15 DIAGNOSIS — Z7689 Persons encountering health services in other specified circumstances: Secondary | ICD-10-CM

## 2019-06-15 DIAGNOSIS — I10 Essential (primary) hypertension: Secondary | ICD-10-CM

## 2019-06-15 DIAGNOSIS — E785 Hyperlipidemia, unspecified: Secondary | ICD-10-CM

## 2019-06-15 DIAGNOSIS — E559 Vitamin D deficiency, unspecified: Secondary | ICD-10-CM

## 2019-06-15 DIAGNOSIS — E66811 Obesity, class 1: Secondary | ICD-10-CM

## 2019-06-15 DIAGNOSIS — E669 Obesity, unspecified: Secondary | ICD-10-CM | POA: Diagnosis not present

## 2019-06-15 DIAGNOSIS — F3341 Major depressive disorder, recurrent, in partial remission: Secondary | ICD-10-CM

## 2019-06-15 NOTE — Progress Notes (Signed)
This note is not being shared with the patient for the following reason: To respect privacy (The patient or proxy has requested that the information not be shared).   Weight Management Follow Up  Assessment: Diagnoses and all orders for this visit:  Encounter for Weight Management Obesity (BMI 30.0-34.9) Long discussion about weight loss, diet, and INCREASING exercise Discussed final goal weight (below 180lbs) and current weight loss goal (249lbs) Patient will work on decreasing diet sodas, currently one a day. Increase activity, even walking. Patient on phentermine with benefit and no SE, taking drug breaks; continue close follow up. Return in one month for follow up.   Essential hypertension Continue current medications: Monitor blood pressure at home; call if consistently over 130/80 Continue DASH diet.   Reminder to go to the ER if any CP, SOB, nausea, dizziness, severe HA, changes vision/speech, left arm numbness and tingling and jaw pain.   Depression, major, recurrent, in partial remission (HCC) Continue medications: Wellbutrin XL 150mg  & Celexa Discussed stress management techniques  Discussed, increase water,intake & good sleep hygiene  Discussed increasing exercise & vegetables in diet Discussed importance of doing something for himself, walking activity he enjoys to help with mental and physical health. Has not made this change Recommended including spouse in exercise/walking to encourage communication   Plan: General weight loss/lifestyle modification strategies discussed (elicit support from others; identify saboteurs; non-food rewards, etc). Continue food diary Continue restricted calorie diet Continue daily exercise as well as behavior modification such as walking further away, putting down the fork, having a plan, using stairs, etc.  Medication: phentermine 37.5mg  Follow up in one month    Future Appointments  Date Time Provider Department Center   03/23/2019  2:00 PM 03/25/2019, NP [facility] None  02/23/2020  2:00 PM 02/25/2020, PA-C [facility] None    Bernard Thompson is a 49 y.o.male presents for a follow up after being on phentermine for weight loss.  While on the medication they have lost 16lbs but up 4lbs since last visit. He denies palpitations, anxiety, trouble sleeping, elevated BP.   He has history of depression and is currently taking Wellbutrin XL 150mg  and celexa.   Last appointment, one month ago, he described worsening depression symptoms.  We discussed adding activities/hobby and/or making time for himself.  Reports he feels like he is just living so everyone else can benefit from his hard work.  He reports that he did not make any lifestyle changes since last visit.  He reports he wants to but just isn't sure what to do.  He denies any mental or physical abuse and denies any SI/HI.  He reports he has seen a pyschiatrist in the past but feels like that did not help him.  He reports he has not participated in marriage counseling.  He did not make any lifestyle or activity changes since last visit.  He reports he has not participated in any activities for himself.  He feels like he is stuck and he is having "dark thoughts" but did not elaborate on this.  He denies SI/HI both at beginning and end of appointment.  Discussed at length changing routines and adding an activity that he enjoys.  He had a hard time verbalizing an activity he enjoys doing.  Strongly encouraged patient to use counseling services.  Also discussed witting down his thoughts if he was reluctant to verbalize.  BP Readings from Last 3 Encounters:  06/15/19 128/88  04/27/19 120/88  03/23/19 120/88    BMI  is Body mass index is 34.31 kg/m., he is working on diet and has not chaged his activity level at this point. Wt Readings from Last 3 Encounters:  06/15/19 253 lb (114.8 kg)  04/27/19 249 lb (112.9 kg)  03/23/19 265 lb (120.2 kg)   He works night  shift.  No changes to his typical diet from previous office appointment.  Typical breakfast: 2:30pm Grilled chicken salad, shredded cheese olives, oilive oil dressing.  Typical lunch: 6-7pm celery, cucumbers or some vegetable and lean meat, [place] or chicken.  Typical dinner: midnight baked chicken with servings of vegetables  Bedtime 6-7am  He has continue to stay hydrated. He is mainly drinking water, 2-3liters a night and coffee (240z) sweetener, coffeemate creamer, and one can of coke zero daily.    Medications: Current Outpatient Medications on File Prior to Visit  Medication Sig Dispense Refill  . ALPRAZolam (XANAX) 0.5 MG tablet Take 1/2-1 tablet 1 - 2 x /day ONLY if needed for Panic or Anxiety Attack &  limit to 5 days /week to avoid addiction 60 tablet 0  . buPROPion (WELLBUTRIN XL) 150 MG 24 hr tablet Take 1 tablet Daily for Mood, Focus & Concentration 90 tablet 0  . Cholecalciferol (VITAMIN D3) 5000 units CAPS Take by mouth.    . citalopram (CELEXA) 40 MG tablet Take 1 tablet Daily for Mood 90 tablet 0   No current facility-administered medications on file prior to visit.    ROS: All negative except for above  Physical exam: Vitals:   06/15/19 1511  BP: 128/88  Pulse: 79  Temp: (!) 97.3 F (36.3 C)  SpO2: 97%   Physical Exam Constitutional:      Appearance: Normal appearance. He is obese.  Cardiovascular:     Rate and Rhythm: Normal rate and regular rhythm.     Pulses: Normal pulses.  Skin:    General: Skin is warm and dry.     Capillary Refill: Capillary refill takes less than 2 seconds.  Neurological:     General: No focal deficit present.     Mental Status: He is alert and oriented to person, place, and time. Mental status is at baseline.  Psychiatric:        Behavior: Behavior normal.        Thought Content: Thought content normal.        Judgment: Judgment normal.     Comments: Flat affect      Garnet Sierras, NP [facility] 06/14/2018  4:30 PM

## 2019-06-16 ENCOUNTER — Encounter (INDEPENDENT_AMBULATORY_CARE_PROVIDER_SITE_OTHER): Payer: Self-pay

## 2019-06-18 LAB — LIPID PANEL
Cholesterol: 190 mg/dL (ref <200)
HDL: 42 mg/dL (ref >=40)
LDL Cholesterol (Calc): 120 mg/dL (calc) — ABNORMAL HIGH
Non-HDL Cholesterol (Calc): 148 mg/dL (calc) — ABNORMAL HIGH (ref <130)
Total CHOL/HDL Ratio: 4.5 (calc) (ref <5.0)
Triglycerides: 168 mg/dL — ABNORMAL HIGH (ref <150)

## 2019-06-18 LAB — CBC WITH DIFFERENTIAL/PLATELET
Absolute Monocytes: 489 cells/uL (ref 200–950)
Basophils Absolute: 60 cells/uL (ref 0–200)
Basophils Relative: 0.9 %
Eosinophils Absolute: 208 cells/uL (ref 15–500)
Eosinophils Relative: 3.1 %
HCT: 45.2 % (ref 38.5–50.0)
Hemoglobin: 15.7 g/dL (ref 13.2–17.1)
Lymphs Abs: 1688 cells/uL (ref 850–3900)
MCH: 29 pg (ref 27.0–33.0)
MCHC: 34.7 g/dL (ref 32.0–36.0)
MCV: 83.5 fL (ref 80.0–100.0)
MPV: 9.7 fL (ref 7.5–12.5)
Monocytes Relative: 7.3 %
Neutro Abs: 4255 cells/uL (ref 1500–7800)
Neutrophils Relative %: 63.5 %
Platelets: 288 10*3/uL (ref 140–400)
RBC: 5.41 10*6/uL (ref 4.20–5.80)
RDW: 12.5 % (ref 11.0–15.0)
Total Lymphocyte: 25.2 %
WBC: 6.7 10*3/uL (ref 3.8–10.8)

## 2019-06-18 LAB — TEST AUTHORIZATION 2

## 2019-06-18 LAB — TEST AUTHORIZATION 3

## 2019-06-18 LAB — COMPLETE METABOLIC PANEL WITH GFR
AG Ratio: 2 (calc) (ref 1.0–2.5)
ALT: 20 U/L (ref 9–46)
AST: 16 U/L (ref 10–40)
Albumin: 4.4 g/dL (ref 3.6–5.1)
Alkaline phosphatase (APISO): 80 U/L (ref 36–130)
BUN: 11 mg/dL (ref 7–25)
CO2: 27 mmol/L (ref 20–32)
Calcium: 9.6 mg/dL (ref 8.6–10.3)
Chloride: 104 mmol/L (ref 98–110)
Creat: 1.27 mg/dL (ref 0.60–1.35)
GFR, Est African American: 76 mL/min/{1.73_m2} (ref >=60)
GFR, Est Non African American: 66 mL/min/{1.73_m2} (ref >=60)
Globulin: 2.2 g/dL (calc) (ref 1.9–3.7)
Glucose, Bld: 93 mg/dL (ref 65–99)
Potassium: 4.6 mmol/L (ref 3.5–5.3)
Sodium: 138 mmol/L (ref 135–146)
Total Bilirubin: 0.6 mg/dL (ref 0.2–1.2)
Total Protein: 6.6 g/dL (ref 6.1–8.1)

## 2019-06-18 LAB — HEMOGLOBIN A1C
Hgb A1c MFr Bld: 5.2 % of total Hgb (ref <5.7)
Mean Plasma Glucose: 103 (calc)
eAG (mmol/L): 5.7 (calc)

## 2019-06-18 LAB — VITAMIN D 25 HYDROXY (VIT D DEFICIENCY, FRACTURES): Vit D, 25-Hydroxy: 60 ng/mL (ref 30–100)

## 2019-06-27 ENCOUNTER — Other Ambulatory Visit: Payer: Self-pay | Admitting: Physician Assistant

## 2019-07-12 ENCOUNTER — Other Ambulatory Visit: Payer: Self-pay | Admitting: Internal Medicine

## 2019-07-12 MED ORDER — BUPROPION HCL ER (XL) 150 MG PO TB24: ORAL_TABLET | ORAL | 0 refills | Status: DC

## 2019-07-12 MED ORDER — CITALOPRAM HYDROBROMIDE 40 MG PO TABS: ORAL_TABLET | ORAL | 1 refills | Status: DC

## 2019-07-20 ENCOUNTER — Ambulatory Visit: Payer: 59 | Admitting: Adult Health Nurse Practitioner

## 2019-07-23 ENCOUNTER — Encounter (INDEPENDENT_AMBULATORY_CARE_PROVIDER_SITE_OTHER): Payer: Self-pay

## 2019-07-27 ENCOUNTER — Ambulatory Visit: Payer: 59 | Admitting: Adult Health Nurse Practitioner

## 2019-07-27 ENCOUNTER — Other Ambulatory Visit: Payer: Self-pay

## 2019-07-27 ENCOUNTER — Encounter: Payer: Self-pay | Admitting: Adult Health Nurse Practitioner

## 2019-07-27 VITALS — BP 130/82 | HR 81 | Temp 96.8°F | Wt 252.0 lb

## 2019-07-27 DIAGNOSIS — E669 Obesity, unspecified: Secondary | ICD-10-CM | POA: Diagnosis not present

## 2019-07-27 DIAGNOSIS — E66811 Obesity, class 1: Secondary | ICD-10-CM

## 2019-07-27 MED ORDER — PHENTERMINE HCL 37.5 MG PO CAPS: 37.5000 mg | ORAL_CAPSULE | ORAL | 0 refills | Status: DC

## 2019-07-27 NOTE — Progress Notes (Signed)
Weight Management Follow Up  Assessment: Diagnoses and all orders for this visit:  Encounter for Weight Management Obesity (BMI 30.0-34.9) Long discussion about weight loss, diet, and INCREASING exercise. Has done some hiking. Discussed final goal weight (below 180lbs) and current weight loss goal (249lbs) Patient will work on decreasing diet sodas, currently one a day. Increase activity, even walking. Patient on phentermine with benefit and no SE, taking drug breaks; continue close follow up. Return in one month for follow up.   Essential hypertension Continue current medications: Monitor blood pressure at home; call if consistently over 130/80 Continue DASH diet.   Reminder to go to the ER if any CP, SOB, nausea, dizziness, severe HA, changes vision/speech, left arm numbness and tingling and jaw pain.   Depression, major, recurrent, in partial remission (HCC) Continue medications: Wellbutrin XL 150mg  & Celexa With continued concern of depression/stress management Unable to get patient to elaborate on this. Provided recommendation to psychologist and continue to encourage journaling Discussed stress management techniques  Discussed, increase water,intake & good sleep hygiene  Discussed increasing exercise & vegetables in diet Discussed importance of doing something for himself, walking activity he enjoys to help with mental and physical health. Has not made this change Recommended including spouse in exercise/walking to encourage communication   Plan: General weight loss/lifestyle modification strategies discussed (elicit support from others; identify saboteurs; non-food rewards, etc). Did not continue food diary, encourage restarting this Continue restricted calorie diet Continue daily exercise as well as behavior modification such as walking further distance gradually , replacing snacks with healthier options, having a plan for meals and snacks, using stairs, etc.   Medication: phentermine 37.5mg  daily in am Follow up in one month    Future Appointments  Date Time Provider Department Center  03/23/2019  2:00 PM 03/25/2019, NP [facility] None  02/23/2020  2:00 PM 02/25/2020, PA-C [facility] None    Bernard Thompson is a 49 y.o.male presents for a follow up after being on phentermine for weight loss.  While on the medication they have lost 16lbs since last visit. He denies palpitations, anxiety, trouble sleeping, elevated BP.   He has history of depression and is currently taking Wellbutrin XL 150mg  and celexa.  Last appointment he described worsening depression symptoms.  We discussed adding activities/hobby and/or making time for himself.  Reports he feels like he is just living so everyone else can benefit from his hard work.  He reports that he did not make any lifestyle changes since last visit.  He reports he wants to but just isn't sure what to do.  He denies any mental or physical abuse and denies any SI/HI.  He reports he has seen a pyschiatrist in the past but feels like that did not help him.  He reports he has not participated in marriage counseling.  Since last OV he did not make any behavior changes.  Despite encouragement of engaging to self rewarding behaviors he continues to report he does not enjoy any activity and apparent there is stress weighing heavy on him.  He does confirm increased stress and or pressure from those in the home.  Has not discussed this with family or sought any counseling services.  He continues to deny SI/HI. BP Readings from Last 3 Encounters:  07/27/19 130/82  06/15/19 128/88  04/27/19 120/88     BMI is Body mass index is 34.18 kg/m., he is working on diet and has not chaged his activity level at this point. Wt Readings from  Last 3 Encounters:  07/27/19 252 lb (114.3 kg)  06/15/19 253 lb (114.8 kg)  04/27/19 249 lb (112.9 kg)   He works night shift.  No changes to his typical diet from last office  appointment.  Typical breakfast: 2:30pm Grilled chicken salad, shredded cheese olives, oilive oil dressing.  Typical lunch: 6-7pm celery, cucumbers or some vegetable and lean meat, [place] or chicken.  Typical dinner: midnight chicken with some vegetables  Bedtime 6-7am  He has continue to stay hydrated. He is mainly drinking water, 2-3liters a night and coffee (240z) sweetener, coffeemate creamer, and one can of coke zero daily.   He reports he has added candy as a snack while at home.  He has also starting having a 3oz glass of bourban to help wind down. He reports that his alprazolam, which he uses PRN, couple times a week, he found the bottle empty.  He reports he is sure those in his home, adults, have been using.  He does not want a refill of this today related to this situation and reports he will be able to handle without.  Discussed securing medication, locked area.  If Rx requested, contact office will not refill today.  Medications: Current Outpatient Medications on File Prior to Visit  Medication Sig Dispense Refill  . ALPRAZolam (XANAX) 0.5 MG tablet Take 1/2-1 tablet 1 - 2 x /day ONLY if needed for Panic or Anxiety Attack &  limit to 5 days /week to avoid addiction 60 tablet 0  . buPROPion (WELLBUTRIN XL) 150 MG 24 hr tablet Take 1 tablet Daily for Mood, Focus & Concentration 90 tablet 0  . Cholecalciferol (VITAMIN D3) 5000 units CAPS Take by mouth.    . citalopram (CELEXA) 40 MG tablet Take 1 tablet Daily for Mood 90 tablet 1   No current facility-administered medications on file prior to visit.    ROS: All negative except for above  Physical exam: Vitals:   07/27/19 1526  BP: 130/82  Pulse: 81  Temp: (!) 96.8 F (36 C)  SpO2: 98%   Physical Exam Constitutional:      Appearance: Normal appearance. He is obese.  Cardiovascular:     Rate and Rhythm: Normal rate and regular rhythm.     Pulses: Normal pulses.  Skin:    General: Skin is warm and dry.      Capillary Refill: Capillary refill takes less than 2 seconds.  Neurological:     General: No focal deficit present.     Mental Status: He is alert and oriented to person, place, and time. Mental status is at baseline.  Psychiatric:        Behavior: Behavior normal.        Thought Content: Thought content normal.        Judgment: Judgment normal.      Garnet Sierras, NP [facility] 07/27/2019  4:30 PM

## 2019-07-27 NOTE — Patient Instructions (Addendum)
   Dr. Evalee Jefferson  [facility] [address] [place], [address]  Phone: [phone]  Fax: [phone]   Ready to book an appointment? Click Here:   [email or web address]

## 2019-08-08 ENCOUNTER — Other Ambulatory Visit: Payer: Self-pay | Admitting: Internal Medicine

## 2019-08-24 ENCOUNTER — Other Ambulatory Visit: Payer: Self-pay

## 2019-08-24 ENCOUNTER — Encounter: Payer: Self-pay | Admitting: Adult Health Nurse Practitioner

## 2019-08-24 ENCOUNTER — Ambulatory Visit: Payer: 59 | Admitting: Adult Health Nurse Practitioner

## 2019-08-24 VITALS — BP 128/86 | HR 84 | Temp 97.0°F | Ht 72.0 in | Wt 245.0 lb

## 2019-08-24 DIAGNOSIS — F3341 Major depressive disorder, recurrent, in partial remission: Secondary | ICD-10-CM | POA: Diagnosis not present

## 2019-08-24 DIAGNOSIS — E66811 Obesity, class 1: Secondary | ICD-10-CM

## 2019-08-24 DIAGNOSIS — Z7689 Persons encountering health services in other specified circumstances: Secondary | ICD-10-CM | POA: Diagnosis not present

## 2019-08-24 DIAGNOSIS — E669 Obesity, unspecified: Secondary | ICD-10-CM | POA: Diagnosis not present

## 2019-08-24 NOTE — Progress Notes (Signed)
Weight Management Follow Up  Assessment: Diagnoses and all orders for this visit:  Encounter for Weight Management Obesity (BMI 30.0-34.9) Long discussion about weight loss, diet, and INCREASING exercise. Has done some hiking, encouraged walking. Discussed final goal weight (below 180lbs) and current weight loss goal (240lbs) Patient will work on decreasing diet sodas, currently one a day. Increase activity, even walking. Patient on phentermine with benefit and no SE, taking drug breaks; continue close follow up.  Return in one month for follow up.   Essential hypertension No medications at this time Monitor blood pressure at home; call if consistently over 130/80 Continue DASH diet.   Reminder to go to the ER if any CP, SOB, nausea, dizziness, severe HA, changes vision/speech, left arm numbness and tingling and jaw pain.   Depression, major, recurrent, in partial remission (HCC) Continue medications: Wellbutrin XL 150mg  & Celexa With continued concern of depression/stress management Unable to get patient to elaborate on this. Provided recommendation to psychologist and continue to encourage journaling Discussed stress management techniques  Discussed, increase water,intake & good sleep hygiene  Discussed increasing exercise & vegetables in diet Discussed importance of doing something for himself, walking activity he enjoys to help with mental and physical health. Has not made this change Recommended including spouse in exercise/walking to encourage communication   Plan: General weight loss/lifestyle modification strategies discussed (elicit support from others; identify saboteurs; non-food rewards, etc). Did not continue food diary, encourage restarting this Continue restricted calorie diet Continue daily exercise as well as behavior modification such as walking further distance gradually , replacing snacks with healthier options, having a plan for meals and snacks, using  stairs, etc.  Medication: phentermine 37.5mg  daily in am Follow up in one month    Future Appointments  Date Time Provider Department Center  03/23/2019  2:00 PM 03/25/2019, NP [facility] None  02/23/2020  2:00 PM 02/25/2020, PA-C [facility] None    Bernard Thompson is a 49 y.o.male presents for a follow up after being on phentermine for weight loss.  While on the medication they have lost 7lbs since last visit and total of 36lbs over the course of six months. He denies palpitations, anxiety, trouble sleeping, elevated BP.   He has history of major depressive disorder and is currently taking Wellbutrin XL 150mg  and celexa.  Last OV one month ago he described worsening depression symptoms.  We discussed adding activities/hobby and/or making time for himself. We have discussed this at length but he has not put this into action.  He continues to report he feels like he is just living so everyone else can benefit from his hard work.  He reports that he did not make any lifestyle changes since last visit.  Although today his he is more upbeat with increased unsolicited communication, which is an improvement.  He denies any mental or physical abuse and denies any SI/HI.    He reports he has seen a pyschiatrist in the past but feels like that did not help him.  He reports he has not participated in marriage counseling or discussed this with spouse.  Continued encouragement to journal of his feelings as way to express himself instead of suppressing.   We again discussed reaching out for psychiatry and or counseling services.  Referral given to patient for Dr 54.  Patient agreeable to make contact for appointment.   BP Readings from Last 3 Encounters:  08/24/19 128/86  07/27/19 130/82  06/15/19 128/88     BMI is  Body mass index is 33.23 kg/m., he is working on diet and has not chaged his activity level at this point. Wt Readings from Last 3 Encounters:  08/24/19 245 lb (111.1 kg)   07/27/19 252 lb (114.3 kg)  06/15/19 253 lb (114.8 kg)   He works night shift.  No changes to his typical diet from last office appointment.  Typical breakfast (first meal): 2:30pm Grilled chicken salad, shredded cheese olives, oilive oil dressing.  He is making this meal at home.  Typical lunch (second meal): 6-7pm celery, cucumbers or some vegetable and lean meat, [place] or chicken.  He has been packing his lunch.  He has been eating out some and making better choices, I.e. salads, sandwiches on wheat bread.  Typical dinner: midnight chicken with some vegetables  Bedtime 6-7am  He has continue to stay hydrated, mainly before work and  He is mainly drinking water, 2-3liters a night and coffee (240z) sweetener, coffeemate creamer, and one can of coke zero daily.   Last OV reports he has added candy as a snack while at home.  He has also starting having a 3oz glass of bourban to help wind down.  Since then he has stopped snacking on candy and also stopped drinking bourbon in the evenings after work.  Last OV he reports that his alprazolam, which he uses PRN, couple times a week, he found the bottle empty.  He reports he is sure those in his home, adults, have been using.  He does not want a refill of this today related to this situation and reports he will be able to handle without.  Discussed securing medication, locked area.  If Rx requested, contact office will not refill today.  Today he reports he is doing well with management of his anxiety. Continues to endorse not wanting Rx in the home and will contact office if this changes.  Medications: Current Outpatient Medications on File Prior to Visit  Medication Sig Dispense Refill  . ALPRAZolam (XANAX) 0.5 MG tablet Take 1/2-1 tablet 1 - 2 x /day ONLY if needed for Panic or Anxiety Attack &  limit to 5 days /week to avoid addiction 60 tablet 0  . buPROPion (WELLBUTRIN XL) 150 MG 24 hr tablet Take 1 tablet Daily for Mood, Focus &  Concentration 90 tablet 0  . Cholecalciferol (VITAMIN D3) 5000 units CAPS Take by mouth.    . citalopram (CELEXA) 40 MG tablet Take 1 tablet Daily for Mood 90 tablet 1  . phentermine 37.5 MG capsule Take 1 capsule (37.5 mg total) by mouth every morning. 30 capsule 0   No current facility-administered medications on file prior to visit.    ROS: All negative except for above  Physical exam: Vitals:   08/24/19 1540  BP: 128/86  Pulse: 84  Temp: (!) 97 F (36.1 C)  SpO2: 96%   Physical Exam Constitutional:      Appearance: Normal appearance. He is obese.  Cardiovascular:     Rate and Rhythm: Normal rate and regular rhythm.     Pulses: Normal pulses.  Skin:    General: Skin is warm and dry.     Capillary Refill: Capillary refill takes less than 2 seconds.  Neurological:     General: No focal deficit present.     Mental Status: He is alert and oriented to person, place, and time. Mental status is at baseline.  Psychiatric:        Behavior: Behavior normal.  Thought Content: Thought content normal.        Judgment: Judgment normal.      Elder Negus, NP [facility] 08/24/2019  4:30 PM

## 2019-09-06 ENCOUNTER — Other Ambulatory Visit: Payer: Self-pay | Admitting: Adult Health Nurse Practitioner

## 2019-09-06 DIAGNOSIS — E669 Obesity, unspecified: Secondary | ICD-10-CM

## 2019-09-25 ENCOUNTER — Emergency Department (HOSPITAL_COMMUNITY): Payer: 59

## 2019-09-25 ENCOUNTER — Encounter (HOSPITAL_COMMUNITY): Payer: Self-pay | Admitting: Emergency Medicine

## 2019-09-25 ENCOUNTER — Other Ambulatory Visit: Payer: Self-pay

## 2019-09-25 ENCOUNTER — Emergency Department (HOSPITAL_COMMUNITY)
Admission: EM | Admit: 2019-09-25 | Discharge: 2019-09-25 | Disposition: A | Discharge status: Home / Self Care | Payer: 59 | Attending: Emergency Medicine | Admitting: Emergency Medicine

## 2019-09-25 DIAGNOSIS — I10 Essential (primary) hypertension: Secondary | ICD-10-CM | POA: Insufficient documentation

## 2019-09-25 DIAGNOSIS — R55 Syncope and collapse: Secondary | ICD-10-CM | POA: Diagnosis not present

## 2019-09-25 DIAGNOSIS — Z87891 Personal history of nicotine dependence: Secondary | ICD-10-CM | POA: Diagnosis not present

## 2019-09-25 DIAGNOSIS — R0789 Other chest pain: Secondary | ICD-10-CM | POA: Diagnosis not present

## 2019-09-25 DIAGNOSIS — Z79899 Other long term (current) drug therapy: Secondary | ICD-10-CM | POA: Insufficient documentation

## 2019-09-25 LAB — COMPREHENSIVE METABOLIC PANEL
ALT: 29 U/L (ref 0–44)
AST: 26 U/L (ref 15–41)
Albumin: 4.1 g/dL (ref 3.5–5.0)
Alkaline Phosphatase: 68 U/L (ref 38–126)
Anion gap: 10 (ref 5–15)
BUN: 16 mg/dL (ref 6–20)
CO2: 24 mmol/L (ref 22–32)
Calcium: 9 mg/dL (ref 8.9–10.3)
Chloride: 104 mmol/L (ref 98–111)
Creatinine, Ser: 1.42 mg/dL — ABNORMAL HIGH (ref 0.61–1.24)
GFR calc Af Amer: >60 mL/min (ref >60)
GFR calc non Af Amer: 57 mL/min — ABNORMAL LOW (ref >60)
Glucose, Bld: 98 mg/dL (ref 70–99)
Potassium: 4 mmol/L (ref 3.5–5.1)
Sodium: 138 mmol/L (ref 135–145)
Total Bilirubin: 1.4 mg/dL — ABNORMAL HIGH (ref 0.3–1.2)
Total Protein: 6.6 g/dL (ref 6.5–8.1)

## 2019-09-25 LAB — CBC WITH DIFFERENTIAL/PLATELET
Abs Immature Granulocytes: 0.03 10*3/uL (ref 0.00–0.07)
Basophils Absolute: 0 10*3/uL (ref 0.0–0.1)
Basophils Relative: 1 %
Eosinophils Absolute: 0.1 10*3/uL (ref 0.0–0.5)
Eosinophils Relative: 2 %
HCT: 46.9 % (ref 39.0–52.0)
Hemoglobin: 16 g/dL (ref 13.0–17.0)
Immature Granulocytes: 0 %
Lymphocytes Relative: 16 %
Lymphs Abs: 1.1 10*3/uL (ref 0.7–4.0)
MCH: 29.1 pg (ref 26.0–34.0)
MCHC: 34.1 g/dL (ref 30.0–36.0)
MCV: 85.4 fL (ref 80.0–100.0)
Monocytes Absolute: 0.4 10*3/uL (ref 0.1–1.0)
Monocytes Relative: 6 %
Neutro Abs: 5.4 10*3/uL (ref 1.7–7.7)
Neutrophils Relative %: 75 %
Platelets: 242 10*3/uL (ref 150–400)
RBC: 5.49 MIL/uL (ref 4.22–5.81)
RDW: 11.9 % (ref 11.5–15.5)
WBC: 7.1 10*3/uL (ref 4.0–10.5)
nRBC: 0 % (ref 0.0–0.2)

## 2019-09-25 LAB — TROPONIN I (HIGH SENSITIVITY)
Troponin I (High Sensitivity): 5 ng/L (ref <18)
Troponin I (High Sensitivity): 4 ng/L (ref <18)

## 2019-09-25 LAB — MAGNESIUM: Magnesium: 2 mg/dL (ref 1.7–2.4)

## 2019-09-25 LAB — D-DIMER, QUANTITATIVE: D-Dimer, Quant: 0.64 ug/mL-FEU — ABNORMAL HIGH (ref 0.00–0.50)

## 2019-09-25 MED ORDER — SODIUM CHLORIDE 0.9 % IV BOLUS
1000.0000 mL | Freq: Once | INTRAVENOUS | Status: AC
  Administered 2019-09-25: 1000 mL via INTRAVENOUS

## 2019-09-25 MED ORDER — IOHEXOL 350 MG/ML SOLN
100.0000 mL | Freq: Once | INTRAVENOUS | Status: AC | PRN
  Administered 2019-09-25: 70 mL via INTRAVENOUS

## 2019-09-25 NOTE — ED Notes (Signed)
ED Provider at bedside. 

## 2019-09-25 NOTE — ED Notes (Signed)
Discharge instructions reviewed with pt. Pt verbalized understanding.   

## 2019-09-25 NOTE — ED Notes (Signed)
No complaints from the pt

## 2019-09-25 NOTE — ED Notes (Signed)
Patient reports he worked last night, slept approximately 3-4 hours, got up to use the restroom and began having an odd feeling in his chest that he is unable to describe. States he felt like he was having palpitations and like he may pass out as well. He reports kneeling down and then lying down on the ground, states he began having hot and cold sweats. Denies pain radiation or sob. EMS BP initially low at 70/30, improved to 100/60 with 1L of NS. Ems also reported a HR of 40 with improvement into the 60s and 70s after fluids. Pt does have an Rx for phentermine which he took around 1400 yesterday, has been taking for about 4 months. He is a/ox4, resp e/u, he denies any dizziness or palpitations at this time but states his chest still "does not feel normal." he is unable to elaborate on this any further.

## 2019-09-25 NOTE — ED Triage Notes (Signed)
EMS reported Pt works 3rd shift and when he got up to go to [facility] he felt palpatation ,nera syncope .

## 2019-09-25 NOTE — ED Provider Notes (Signed)
[facility] EMERGENCY DEPARTMENT Provider Note   CSN: [id number] Arrival date & time: 09/25/19  1302     History Chief Complaint  Patient presents with  . Near Syncope    Bernard Thompson is a 49 y.o. male.  The history is provided by the patient, the EMS personnel and medical records. No language interpreter was used.  Near Syncope   Bernard Thompson is a 49 y.o. male who presents to the Emergency Department complaining of near syncope. He presents the emergency department by EMS for evaluation following a near syncopal event that occurred at home. He states that he woke up to use the bathroom and urinated. About one minute later he began to feel funny all over, lightheaded with central chest discomfort. He was able to slowly let himself down to the floor. He did not fully lose consciousness. Overall he is feeling improved but does have some residual chest discomfort. He was profoundly diaphoretic and clammy with this episode. He denies any fevers, headache, difficulty breathing, nausea, vomiting, diarrhea. No black or bloody stools. No prior similar symptoms. No leg swelling or pain.   for EMS the patient had a blood pressure of 70/30 that improved with later normal saline.  Past Medical History:  Diagnosis Date  . Anxiety   . Depression   . HLD (hyperlipidemia)   . HTN (hypertension)     Patient Active Problem List   Diagnosis Date Noted  . Essential hypertension 05/28/2016  . Prediabetes 05/28/2016  . Hyperlipidemia 05/28/2016  . Medication management 05/28/2016  . Hypogonadism in male 05/28/2016  . Vitamin D deficiency 05/28/2016  . Erectile dysfunction 05/28/2016  . Depression, major, recurrent, in partial remission (HCC) 05/28/2016    Past Surgical History:  Procedure Laterality Date  . FOOT FRACTURE SURGERY  1985       Family History  Problem Relation Age of Onset  . Diabetes Mother   . Heart disease Mother   . Hypertension Mother   . Cancer  Maternal Grandmother   . Diabetes Maternal Grandfather   . Breast cancer Neg Hx     Social History   Tobacco Use  . Smoking status: Former Smoker    Quit date: 06/10/1990    Years since quitting: 29.3  . Smokeless tobacco: Never Used  Substance Use Topics  . Alcohol use: No  . Drug use: No    Home Medications Prior to Admission medications   Medication Sig Start Date End Date Taking? Authorizing Provider  ALPRAZolam (XANAX) 0.5 MG tablet Take 1/2-1 tablet 1 - 2 x /day ONLY if needed for Panic or Anxiety Attack &  limit to 5 days /week to avoid addiction Patient taking differently: Take 0.5 mg by mouth See admin instructions. Take 1/2-1 tablet 1 - 2 x /day ONLY if needed for Panic or Anxiety Attack &  limit to 5 days /week to avoid addiction 05/08/19  Yes Lucky Cowboy, MD  Cholecalciferol (VITAMIN D3) 5000 units CAPS Take 1 capsule by mouth daily.    Yes [provider]  citalopram (CELEXA) 40 MG tablet Take 1 tablet Daily for Mood Patient taking differently: Take 40 mg by mouth daily. for Mood 07/12/19  Yes Lucky Cowboy, MD  phentermine 37.5 MG capsule TAKE 1 CAPSULE(37.5 MG) BY MOUTH EVERY MORNING Patient taking differently: Take 37.5 mg by mouth every morning.  09/08/19  Yes McClanahan, Kyra, NP  buPROPion (WELLBUTRIN XL) 150 MG 24 hr tablet Take 1 tablet Daily for Mood, Focus &  Concentration Patient not taking: Reported on 09/25/2019 07/12/19   Unk Pinto, MD    Allergies    Patient has no known allergies.  Review of Systems   Review of Systems  Cardiovascular: Positive for near-syncope.  All other systems reviewed and are negative.   Physical Exam Updated Vital Signs BP 128/75   Pulse 71   Temp (!) 96.8 F (36 C) (Oral)   Resp 20   SpO2 96%   Physical Exam Vitals and nursing note reviewed.  Constitutional:      Appearance: He is well-developed.     Comments: Uncomfortable apearing  HENT:     Head: Normocephalic and atraumatic.    Cardiovascular:     Rate and Rhythm: Normal rate and regular rhythm.     Heart sounds: No murmur.  Pulmonary:     Effort: Pulmonary effort is normal. No respiratory distress.     Breath sounds: Normal breath sounds.  Abdominal:     Palpations: Abdomen is soft.     Tenderness: There is no abdominal tenderness. There is no guarding or rebound.  Musculoskeletal:        General: No swelling or tenderness.  Skin:    General: Skin is warm and dry.  Neurological:     Mental Status: He is alert and oriented to person, place, and time.  Psychiatric:        Behavior: Behavior normal.     ED Results / Procedures / Treatments   Labs (all labs ordered are listed, but only abnormal results are displayed) Labs Reviewed  COMPREHENSIVE METABOLIC PANEL - Abnormal; Notable for the following components:      Result Value   Creatinine, Ser 1.42 (*)    Total Bilirubin 1.4 (*)    GFR calc non Af Amer 57 (*)    All other components within normal limits  D-DIMER, QUANTITATIVE (NOT AT [facility]) - Abnormal; Notable for the following components:   D-Dimer, Quant 0.64 (*)    All other components within normal limits  CBC WITH DIFFERENTIAL/PLATELET  MAGNESIUM  TROPONIN I (HIGH SENSITIVITY)  TROPONIN I (HIGH SENSITIVITY)    EKG EKG Interpretation  Date/Time:  Saturday September 25 2019 13:16:47 EDT Ventricular Rate:  66 PR Interval:    QRS Duration: 94 QT Interval:  410 QTC Calculation: 430 R Axis:   50 Text Interpretation: Sinus rhythm Confirmed by Quintella Reichert [id number]) on 09/25/2019 1:21:55 PM   Radiology DG Chest 2 View  Result Date: 09/25/2019 CLINICAL DATA:  49 year old with near syncope. EXAM: CHEST - 2 VIEW COMPARISON:  None. FINDINGS: Lungs are clear without focal airspace disease or pulmonary edema. Heart and mediastinum are within normal limits. Trachea is midline. No large pleural effusions. Negative for a pneumothorax. IMPRESSION: No active cardiopulmonary disease. Electronically  Signed   By: Markus Thompson M.D.   On: 09/25/2019 13:55   CT Angio Chest PE W/Cm &/Or Wo Cm  Result Date: 09/25/2019 CLINICAL DATA:  Near syncope. EXAM: CT ANGIOGRAPHY CHEST WITH CONTRAST TECHNIQUE: Multidetector CT imaging of the chest was performed using the standard protocol during bolus administration of intravenous contrast. Multiplanar CT image reconstructions and MIPs were obtained to evaluate the vascular anatomy. CONTRAST:  61mL OMNIPAQUE IOHEXOL 350 MG/ML SOLN COMPARISON:  Chest radiographs obtained earlier today. FINDINGS: Cardiovascular: Satisfactory opacification of the pulmonary arteries to the segmental level. No evidence of pulmonary embolism. Normal heart size. No pericardial effusion. Mediastinum/Nodes: No enlarged mediastinal, hilar, or axillary lymph nodes. Thyroid gland, trachea, and esophagus demonstrate no  significant findings. Lungs/Pleura: Small triangular shaped area of ground-glass opacity in the superior aspect of the right lower lobe, anteriorly, measuring 11 mm in mean diameter on coronal image number 90 series 8. This is also seen on axial image number 61 series 6. There is minimal similar density in the right lower lobe axial image number 73 series 6, sagittal image number 64 series 9 and coronal image number 80 series 8. This is discrete enough to measure. The remainder of the lungs are clear. No pleural fluid. Upper Abdomen: Unremarkable. Musculoskeletal: Minimal thoracic and lower cervical spine degenerative changes. Review of the MIP images confirms the above findings. IMPRESSION: 1. No pulmonary emboli. 2. Small triangular shaped area of ground-glass opacity in the superior aspect of the right lower lobe and minimal similar density in the right lower lobe. These are most likely inflammatory in nature. Non-contrast chest CT at 3-6 months is recommended. If the nodules are stable at time of repeat CT, then future CT at 18-24 months (from today's scan) is considered optional for  low-risk patients, but is recommended for high-risk patients. This recommendation follows the consensus statement: Guidelines for Management of Incidental Pulmonary Nodules Detected on CT Images: From the Fleischner Society 2017; Radiology 2017; 284:228-243. Electronically Signed   By: Beckie Salts M.D.   On: 09/25/2019 18:12    Procedures Procedures (including critical care time)  Medications Ordered in ED Medications  sodium chloride 0.9 % bolus 1,000 mL (0 mLs Intravenous Stopped 09/25/19 1529)  iohexol (OMNIPAQUE) 350 MG/ML injection 100 mL (70 mLs Intravenous Contrast Given 09/25/19 1750)    ED Course  I have reviewed the triage vital signs and the nursing notes.  Pertinent labs & imaging results that were available during my care of the patient were reviewed by me and considered in my medical decision making (see chart for details).    MDM Rules/Calculators/A&P                     Patient here for evaluation following near syncopal episode. Patient ill appearing on initial assessment. On recheck he feels improved it appears improved. Labs with mild renal insufficiency, similar when compared to priors. No significant electrolyte abnormalities or anemia. His D dimer is mildly elevated and CTA was obtained, which is negative for PE. Discussed with patient CT findings of lung changes and recommendation for PCP follow-up for recheck. Presentation is not consistent with pneumonia, ACS. Discussed unclear cause of syncope with patient, question maturational syncope. Discussed outpatient follow-up and return precautions.  Final Clinical Impression(s) / ED Diagnoses Final diagnoses:  Near syncope    Rx / DC Orders ED Discharge Orders    None       Tilden Fossa, MD 09/25/19 1931

## 2019-09-25 NOTE — Discharge Instructions (Addendum)
You had a CT scan of your chest today with a small area of in your lung.  Please follow up with your family doctor for further evaluation.

## 2019-09-25 NOTE — ED Notes (Signed)
Patient transported to [facility]. ?

## 2019-09-27 NOTE — Progress Notes (Signed)
One Month Follow Up  Assessment:  Bernard Thompson was seen today for follow-up.  Diagnoses and all orders for this visit:   Encounter for Weight Management Obesity (BMI 30.0-34.9) Long discussion about weight loss, diet, and INCREASING exercise. Has done some hiking, encouraged walking. Discussed final goal weight (below 180lbs) and current weight loss goal (240lbs) Patient will work on decreasing diet sodas, currently one a day. Increase activity, even walking. Patient on phentermine with benefit and no SE, taking drug breaks; continue close follow up.  Return in one month for follow up.   Essential hypertension No medications at this time Monitor blood pressure at home; call if consistently over 130/80 Continue DASH diet.   Reminder to go to the ER if any CP, SOB, nausea, dizziness, severe HA, changes vision/speech, left arm numbness and tingling and jaw pain.   Depression, major, recurrent, in partial remission (HCC) Continue medications: Wellbutrin XL 150mg  & Celexa With continued concern of depression/stress management Provided recommendation to psychologist and continue to encourage journaling Discussed stress management techniques  Discussed, increase water,intake & good sleep hygiene  Discussed increasing exercise & vegetables in diet Discussed importance of doing something for himself, walking activity he enjoys to help with mental and physical health. Has not made this change Recommended including spouse in exercise/walking to encourage communication  Near syncope  Discussed at length with patient Increase water intake Monitor blood pressure at home. Dehydration? Likely isolated incident Release to work office with any new or worsening symptoms Close follow up in one month  Ground glass opacity present on imaging of lung Found on CT, incidental Recommendation to follow up in 3-6 months repeat CT Asymptomatic at this time Discussed CT results with  patient at length All questions answered, patient reports feeling better about results.     Plan: General weight loss/lifestyle modification strategies discussed (elicit support from others; identify saboteurs; non-food rewards, etc). Did not continue food diary, encourage restarting this Continue restricted calorie diet Continue daily exercise as well as behavior modification such as walking further distance gradually , replacing snacks with healthier options, having a plan for meals and snacks, using stairs, etc.  Medication: STOP phentermine 37.5mg  at this time  Follow up in one month    Future Appointments  Date Time Provider Department Center  03/23/2019  2:00 PM 03/25/2019, NP [facility] None  02/23/2020  2:00 PM 02/25/2020, PA-C [facility] None   HPI:  Bernard Thompson is a 49 y.o male here for monthly weight check in for weight management. Reports he had a hospital evaluation on 09/25/19 for near syncopal episode.  AT hospital he had CT angio which showed: IMPRESSION: 1. No pulmonary emboli. 2. Small triangular shaped area of ground-glass opacity in the superior aspect of the right lower lobe and minimal similar density in the right lower lobe. These are most likely inflammatory in nature. Non-contrast chest CT at 3-6 months is recommended. If the nodules are stable at time of repeat CT, then future CT at 18-24 months (from today's scan) is considered optional for low-risk patients, but is recommended for high-risk patients. This recommendation follows the consensus statement: Guidelines for Management of Incidental Pulmonary Nodules Detected on CT Images: From the Fleischner Society 2017; Radiology 2017; 284:228-243.   Electronically Signed   By: 12-28-1988 M.D.   On: 09/25/2019 18:12   Reports he was tired the next day.  He has not had any symptoms like this in the past.  Since he has not had  any similar symptoms but reports he is concerned about his  health.  He reports there has been a lot of stress at work.  We have discussed his poor stress management in previous appointments and he has initiated psychiatry/counseling services to help with this.  He has stopped the phentermine for weight loss at this time.  Labs were unremarkable from ED visit.  Some dehydration noted which he reports he has improved his water intake.  He has not been taking alprazolam on his profile at this time.  Blood pressure is slightly elevated today and he reports this is related to concern about his CT scan in the hospital.  He reports that they told him they found something abnormal but he is not sure what that means.  Healso has history of major depressive disorder and is currently taking Wellbutrin XL 150mg  and celexa.  Last OV one month ago he described worsening depression symptoms and has reached out for psycholoic/counseling services to help with this.  We discussed adding activities/hobby and/or making time for himself as means of stress management. We have discussed this at length but he has not put this into action.  He denies any mental or physical abuse and denies any SI/HI.      BP Readings from Last 3 Encounters:  09/28/19 (!) 130/98  09/25/19 128/75  08/24/19 128/86     BMI is Body mass index is 33.23 kg/m., he is working on diet and has not chaged his activity level at this point. Wt Readings from Last 3 Encounters:  09/28/19 245 lb (111.1 kg)  08/24/19 245 lb (111.1 kg)  07/27/19 252 lb (114.3 kg)   He works night shift.  No changes to his typical diet from last office appointment.  Typical breakfast (first meal): 2:30pm Grilled chicken salad, shredded cheese olives, oilive oil dressing.  He is making this meal at home.  Typical lunch (second meal): 6-7pm He has been eating cattered type meals provided by company or eating out.  He knows he is not making the best choices for his meals.  Typical dinner: midnight chicken with some  vegetables  Bedtime 6-7am  He has continue to stay hydrated, mainly before work and  He is mainly drinking water, 2-3liters a night and coffee (240z) sweetener, coffeemate creamer, and one can of coke zero daily. Reports he was drinking less than normal since last OV.   Medications: Current Outpatient Medications on File Prior to Visit  Medication Sig Dispense Refill  . ALPRAZolam (XANAX) 0.5 MG tablet Take 1/2-1 tablet 1 - 2 x /day ONLY if needed for Panic or Anxiety Attack &  limit to 5 days /week to avoid addiction (Patient taking differently: Take 0.5 mg by mouth See admin instructions. Take 1/2-1 tablet 1 - 2 x /day ONLY if needed for Panic or Anxiety Attack &  limit to 5 days /week to avoid addiction) 60 tablet 0  . Cholecalciferol (VITAMIN D3) 5000 units CAPS Take 1 capsule by mouth daily.     . citalopram (CELEXA) 40 MG tablet Take 1 tablet Daily for Mood (Patient taking differently: Take 40 mg by mouth daily. for Mood) 90 tablet 1   No current facility-administered medications on file prior to visit.    Review of Systems  Constitutional: Negative for chills, diaphoresis, fever, malaise/fatigue and weight loss.  HENT: Negative for congestion, ear discharge, ear pain, hearing loss, nosebleeds, sinus pain, sore throat and tinnitus.   Eyes: Negative for blurred vision and double vision.  Respiratory: Negative for cough, hemoptysis and stridor.   Cardiovascular: Negative for chest pain, palpitations, orthopnea, claudication, leg swelling and PND.  Gastrointestinal: Negative for abdominal pain, blood in stool, constipation, diarrhea, heartburn, melena, nausea and vomiting.  Genitourinary: Negative for dysuria, frequency and urgency.  Musculoskeletal: Negative for back pain, falls, joint pain, myalgias and neck pain.  Skin: Negative for itching and rash.  Neurological: Negative for dizziness, tingling, tremors, sensory change, speech change, focal weakness, seizures, loss of  consciousness, weakness and headaches.  Psychiatric/Behavioral: Positive for depression. Negative for hallucinations, memory loss, substance abuse and suicidal ideas. The patient is nervous/anxious. The patient does not have insomnia.      Physical exam: Vitals:   09/28/19 1527  BP: (!) 130/98  Pulse: 73  Temp: (!) 97.3 F (36.3 C)  SpO2: 99%    General Appearance: Well nourished, in no apparent distress. Eyes: PERRLA, EOMs, conjunctiva no swelling or erythema Sinuses: No Frontal/maxillary tenderness ENT/Mouth: Ext aud canals clear, TMs without erythema, bulging. No erythema, swelling, or exudate on post pharynx.  Tonsils not swollen or erythematous. Hearing normal.  Neck: Supple, thyroid normal.  Respiratory: Respiratory effort normal, BS equal bilaterally without rales, rhonchi, wheezing or stridor.  Cardio: RRR with no MRGs. Brisk peripheral pulses without edema.  Abdomen: Soft, + BS.  Non tender, no guarding, rebound, hernias, masses. Lymphatics: Non tender without lymphadenopathy.  Musculoskeletal: Full ROM, 5/5 strength, Normal gait Skin: Warm, dry without rashes, lesions, ecchymosis.  Neuro: Cranial nerves intact. No cerebellar symptoms.  Psych: Awake and oriented X 3, normal affect, Insight and Judgment appropriate.    Garnet Sierras, NP [facility] 08/24/2019  4:30 PM

## 2019-09-28 ENCOUNTER — Ambulatory Visit: Payer: 59 | Admitting: Adult Health Nurse Practitioner

## 2019-09-28 ENCOUNTER — Other Ambulatory Visit: Payer: Self-pay

## 2019-09-28 ENCOUNTER — Encounter: Payer: Self-pay | Admitting: Adult Health Nurse Practitioner

## 2019-09-28 VITALS — BP 130/98 | HR 73 | Temp 97.3°F | Wt 245.0 lb

## 2019-09-28 DIAGNOSIS — E66811 Obesity, class 1: Secondary | ICD-10-CM

## 2019-09-28 DIAGNOSIS — R55 Syncope and collapse: Secondary | ICD-10-CM

## 2019-09-28 DIAGNOSIS — R918 Other nonspecific abnormal finding of lung field: Secondary | ICD-10-CM

## 2019-09-28 DIAGNOSIS — E669 Obesity, unspecified: Secondary | ICD-10-CM

## 2019-09-28 DIAGNOSIS — Z7689 Persons encountering health services in other specified circumstances: Secondary | ICD-10-CM | POA: Diagnosis not present

## 2019-09-28 DIAGNOSIS — F3341 Major depressive disorder, recurrent, in partial remission: Secondary | ICD-10-CM

## 2019-10-06 ENCOUNTER — Other Ambulatory Visit: Payer: Self-pay | Admitting: Internal Medicine

## 2019-10-06 MED ORDER — BUPROPION HCL ER (XL) 150 MG PO TB24: ORAL_TABLET | ORAL | 0 refills | Status: DC

## 2019-10-24 ENCOUNTER — Other Ambulatory Visit: Payer: Self-pay | Admitting: Internal Medicine

## 2019-11-02 ENCOUNTER — Ambulatory Visit: Payer: 59 | Admitting: Adult Health Nurse Practitioner

## 2020-02-21 NOTE — Progress Notes (Signed)
Complete Physical  Assessment and Plan:  Depression, major, recurrent, in partial remission (HCC) Patient with depression at this time, lack of motivation, some poorly directed frustration primarily at women, no hallucinations, no thoughts of suicide, no plans of harm to others.  Will refer to counseling Discussed go to ER or call office if any thought of self harm or thoughts of harming others.  Close follow up  Continue celexa  Abnormal CT of the chest -     CT Chest Wo Contrast; Future -     QuantiFERON-TB Gold Plus - had abnormal CT- will repeat since he is due, rule out TB  Screening PSA (prostate specific antigen) -     PSA  Screen for colon cancer -     Ambulatory referral to Gastroenterology Had colonoscopy several years ago, unknown who  Screening PSA (prostate specific antigen) -     PSA  BMI 38 - follow up 3 months for progress monitoring - increase veggies, decrease carbs - long discussion about weight loss, diet, and exercise  Encounter for general adult medical examination with abnormal findings 1 year  Essential hypertension - continue medications, DASH diet, exercise and monitor at home. Call if greater than 130/80 - cut benicar in half with weight loss.  -     CBC with Differential/Platelet -     Hepatic function panel -     BASIC METABOLIC PANEL WITH GFR -     TSH -     Urinalysis, Routine w reflex microscopic -     Microalbumin / creatinine urine ratio -     EKG 12-Lead  Hyperlipidemia, unspecified hyperlipidemia type - check lipids off med and with weight loss, decrease fatty foods, increase activity.  -     Lipid panel  Abnormal glucose Discussed general issues about diabetes pathophysiology and management., Educational material distributed., Suggested low cholesterol diet., Encouraged aerobic exercise., Discussed foot care., Reminded to get yearly retinal exam.  Medication management -     Magnesium  Vitamin D deficiency -     VITAMIN D 25  Hydroxy (Vit-D Deficiency, Fractures)  Hypogonadism in male Might be better with weight loss, will recheck -     Testosterone  Erectile dysfunction, unspecified erectile dysfunction type weight loss advised  Discussed med's effects and SE's. Screening labs and tests as requested with regular follow-up as recommended. Over 40 minutes of exam, counseling, chart review and critical decision making was performed  HPI Patient presents for a complete physical.   His blood pressure has been controlled at home, today their BP is BP: 128/88 He does not workout. He denies chest pain, shortness of breath, dizziness.  He was started on humira but states it was not helping so he stopped it.  He has had some depression, he has two step kids that he does not get along with great, has his son that he wishes he could see more often.  He is still on the celexa but has gotten off the wellbutrin. He states he has not have a sex life anymore, he has been being more aroused at the thought of violence. He has been very frustrated with women as well, no plans of hurting women or anyone, just passing thoughts, especially when he is angry. Has been the last year and a half and getting worse.   No headaches, no changes in vision. Not hearing voices, no  Hallucinations. Negative RPR in 2019  BMI is Body mass index is 38.52 kg/m., he is working  on diet and exercise. Was on phentermine but stopped after syncopal episode in April. During that ER visit he had CTA that showed abnormal Small triangular shaped area of ground-glass opacity in the superior aspect of the right lower lobe and minimal similar densityin the right lower lobe. These are most likely inflammatory in nature.. 6 pack a year smoking hx, quit 1990. No hot flashes.  Wt Readings from Last 3 Encounters:  02/23/20 284 lb (128.8 kg)  09/28/19 245 lb (111.1 kg)  08/24/19 245 lb (111.1 kg)   He is not on cholesterol medication SUPPOSE TO BE ON CRESTOR BUT  HE IS NOT   His cholesterol is not at goal.  Mom with CAD at 75.  6 pack a year smoking hx, quit 1990. The cholesterol last visit was:   Lab Results  Component Value Date   CHOL 190 06/15/2019   HDL 42 06/15/2019   LDLCALC 120 (H) 06/15/2019   TRIG 168 (H) 06/15/2019   CHOLHDL 4.5 06/15/2019    Last A1C in the office was:  Lab Results  Component Value Date   HGBA1C 5.2 06/15/2019   Last GFR: Lab Results  Component Value Date   GFRNONAA 57 (L) 09/25/2019    Patient is on Vitamin D supplement, 4000 IU.   Lab Results  Component Value Date   VD25OH 60 06/15/2019     Last PSA was: Lab Results  Component Value Date   PSA 0.6 02/23/2019   He has a history of testosterone deficiency, he is not on anything at this time.  Lab Results  Component Value Date   TESTOSTERONE 275 02/23/2019    Current Medications:  Current Outpatient Medications on File Prior to Visit  Medication Sig Dispense Refill  . citalopram (CELEXA) 40 MG tablet Take 1 tablet Daily for Mood (Patient taking differently: Take 40 mg by mouth daily. for Mood) 90 tablet 1   No current facility-administered medications on file prior to visit.   Allergies:  No Known Allergies   Health Maintenance:  Immunization History  Administered Date(s) Administered  . Influenza Inj Mdck Quad With Preservative 02/19/2018  . Influenza,inj,quad, With Preservative 03/05/2016  . Tdap 02/19/2018   Health Maintenance  Topic Date Due  . Hepatitis C Screening  Never done  . COVID-19 Vaccine (1) Never done  . HIV Screening  Never done  . COLONOSCOPY  Never done  . INFLUENZA VACCINE  01/09/2020  . TETANUS/TDAP  02/20/2028   Tetanus: 2019 Pneumovax: Prevnar 13: Flu vaccine: 2019 Zostavax:  DEXA: Colonoscopy: DUE 07/2019 will refer- has had before will get paper chart EGD: Echo 2011  Patient Care Team: Lucky Cowboy, MD as PCP - General (Internal Medicine)  Medical History:  has Essential hypertension;  Prediabetes; Hyperlipidemia; Medication management; Hypogonadism in male; Vitamin D deficiency; Erectile dysfunction; and Depression, major, recurrent, in partial remission (HCC) on their problem list. Surgical History:  He  has a past surgical history that includes Foot fracture surgery (1985). Family History:  His family history includes Cancer in his maternal grandmother; Diabetes in his maternal grandfather and mother; Heart disease in his mother; Hypertension in his mother. Social History:   reports that he quit smoking about 29 years ago. He has never used smokeless tobacco. He reports that he does not drink alcohol and does not use drugs.   Review of Systems:  Review of Systems  Constitutional: Negative.   HENT: Negative.   Eyes: Negative.   Respiratory: Negative.   Cardiovascular: Negative.  Gastrointestinal: Negative.   Genitourinary: Negative.   Musculoskeletal: Negative.   Skin: Negative.   Neurological: Negative.   Endo/Heme/Allergies: Negative.   Psychiatric/Behavioral: Negative.     Physical Exam: Estimated body mass index is 38.52 kg/m as calculated from the following:   Height as of this encounter: 6' (1.829 m).   Weight as of this encounter: 284 lb (128.8 kg). BP 128/88   Pulse (!) 101   Temp 97.7 F (36.5 C)   Ht 6' (1.829 m)   Wt 284 lb (128.8 kg)   SpO2 97%   BMI 38.52 kg/m  General Appearance: Well nourished, in no apparent distress.  Eyes: PERRLA, EOMs, conjunctiva no swelling or erythema, normal fundi and vessels.  Sinuses: No Frontal/maxillary tenderness  ENT/Mouth: Ext aud canals clear, normal light reflex with TMs without erythema, bulging. Good dentition. No erythema, swelling, or exudate on post pharynx. Tonsils not swollen or erythematous. Hearing normal.  Neck: Supple, thyroid normal. No bruits  Respiratory: Respiratory effort normal, BS equal bilaterally without rales, rhonchi, wheezing or stridor.  Cardio: RRR without murmurs, rubs or  gallops. Brisk peripheral pulses without edema.  Chest: symmetric, with normal excursions and percussion.  Abdomen: Soft, nontender, no guarding, rebound, hernias, masses, or organomegaly.  Lymphatics: Non tender without lymphadenopathy.  Genitourinary: defer Musculoskeletal: Full ROM all peripheral extremities,5/5 strength, and normal gait.  Skin: continuing erythematous rash along legs, resolving with back and arms. Warm, dry without rashes, lesions, ecchymosis. Neuro: Cranial nerves intact, reflexes equal bilaterally. Normal muscle tone, no cerebellar symptoms. Sensation intact.  Psych: Awake and oriented X 3, muted/withdrawn affect but will laugh/smile make eye contact at times, Insight and Judgment appropriate.  EKG: just had in ER- will not get this OV- no symptoms AORTA SCAN: defer  [name] 2:16 PM [facility]

## 2020-02-23 ENCOUNTER — Other Ambulatory Visit: Payer: Self-pay

## 2020-02-23 ENCOUNTER — Ambulatory Visit: Payer: 59 | Admitting: Physician Assistant

## 2020-02-23 ENCOUNTER — Encounter: Payer: Self-pay | Admitting: Physician Assistant

## 2020-02-23 VITALS — BP 128/88 | HR 101 | Temp 97.7°F | Ht 72.0 in | Wt 284.0 lb

## 2020-02-23 DIAGNOSIS — F3341 Major depressive disorder, recurrent, in partial remission: Secondary | ICD-10-CM

## 2020-02-23 DIAGNOSIS — Z125 Encounter for screening for malignant neoplasm of prostate: Secondary | ICD-10-CM

## 2020-02-23 DIAGNOSIS — Z136 Encounter for screening for cardiovascular disorders: Secondary | ICD-10-CM | POA: Diagnosis not present

## 2020-02-23 DIAGNOSIS — E559 Vitamin D deficiency, unspecified: Secondary | ICD-10-CM

## 2020-02-23 DIAGNOSIS — Z0000 Encounter for general adult medical examination without abnormal findings: Secondary | ICD-10-CM

## 2020-02-23 DIAGNOSIS — E291 Testicular hypofunction: Secondary | ICD-10-CM

## 2020-02-23 DIAGNOSIS — N529 Male erectile dysfunction, unspecified: Secondary | ICD-10-CM

## 2020-02-23 DIAGNOSIS — I10 Essential (primary) hypertension: Secondary | ICD-10-CM

## 2020-02-23 DIAGNOSIS — R7309 Other abnormal glucose: Secondary | ICD-10-CM

## 2020-02-23 DIAGNOSIS — R9389 Abnormal findings on diagnostic imaging of other specified body structures: Secondary | ICD-10-CM

## 2020-02-23 DIAGNOSIS — Z1211 Encounter for screening for malignant neoplasm of colon: Secondary | ICD-10-CM

## 2020-02-23 DIAGNOSIS — E785 Hyperlipidemia, unspecified: Secondary | ICD-10-CM

## 2020-02-23 DIAGNOSIS — Z79899 Other long term (current) drug therapy: Secondary | ICD-10-CM

## 2020-02-23 DIAGNOSIS — Z0001 Encounter for general adult medical examination with abnormal findings: Secondary | ICD-10-CM

## 2020-02-23 NOTE — Patient Instructions (Addendum)
Counseling services  Can call Patric Buckhalter [phone]   I suggest calling your insurance and finding out who is in your network and THEN calling those people or looking them up on google.   I'm a big fan of Cognitive Behavioral Therapy, look this up on You tube or check with the therapist you see if they are certified.  This form of therapy helps to teach you skills to better handle with current situation that are causing anxiety or depression.   There are some great apps too Check out [employer], give thanks app.  Meditations apps are great like headspace.    Will set up CT chest follow up  Please call the office or message if you have any new headaches, episodes of blurred vision, double vision or complete loss of vision or speech difficulties or motor weakness.  Follow up 4-6 weeks      //

## 2020-02-25 LAB — LIPID PANEL
Cholesterol: 226 mg/dL — ABNORMAL HIGH (ref <200)
HDL: 50 mg/dL (ref >=40)
LDL Cholesterol (Calc): 142 mg/dL (calc) — ABNORMAL HIGH
Non-HDL Cholesterol (Calc): 176 mg/dL (calc) — ABNORMAL HIGH (ref <130)
Total CHOL/HDL Ratio: 4.5 (calc) (ref <5.0)
Triglycerides: 198 mg/dL — ABNORMAL HIGH (ref <150)

## 2020-02-25 LAB — URINALYSIS, ROUTINE W REFLEX MICROSCOPIC
Bilirubin Urine: NEGATIVE
Glucose, UA: NEGATIVE
Hgb urine dipstick: NEGATIVE
Ketones, ur: NEGATIVE
Leukocytes,Ua: NEGATIVE
Nitrite: NEGATIVE
Protein, ur: NEGATIVE
Specific Gravity, Urine: 1.01 (ref 1.001–1.03)
pH: 5.5 (ref 5.0–8.0)

## 2020-02-25 LAB — TESTOSTERONE: Testosterone: 342 ng/dL (ref 250–827)

## 2020-02-25 LAB — CBC WITH DIFFERENTIAL/PLATELET
Absolute Monocytes: 431 cells/uL (ref 200–950)
Basophils Absolute: 39 cells/uL (ref 0–200)
Basophils Relative: 0.7 %
Eosinophils Absolute: 241 cells/uL (ref 15–500)
Eosinophils Relative: 4.3 %
HCT: 46.2 % (ref 38.5–50.0)
Hemoglobin: 16.2 g/dL (ref 13.2–17.1)
Lymphs Abs: 1613 cells/uL (ref 850–3900)
MCH: 29 pg (ref 27.0–33.0)
MCHC: 35.1 g/dL (ref 32.0–36.0)
MCV: 82.8 fL (ref 80.0–100.0)
MPV: 9.7 fL (ref 7.5–12.5)
Monocytes Relative: 7.7 %
Neutro Abs: 3276 cells/uL (ref 1500–7800)
Neutrophils Relative %: 58.5 %
Platelets: 264 10*3/uL (ref 140–400)
RBC: 5.58 10*6/uL (ref 4.20–5.80)
RDW: 12.6 % (ref 11.0–15.0)
Total Lymphocyte: 28.8 %
WBC: 5.6 10*3/uL (ref 3.8–10.8)

## 2020-02-25 LAB — QUANTIFERON-TB GOLD PLUS
Mitogen-NIL: >10 IU/mL
NIL: 0.02 IU/mL
QuantiFERON-TB Gold Plus: NEGATIVE
TB1-NIL: 0 IU/mL
TB2-NIL: <0 IU/mL

## 2020-02-25 LAB — COMPLETE METABOLIC PANEL WITH GFR
AG Ratio: 2 (calc) (ref 1.0–2.5)
ALT: 36 U/L (ref 9–46)
AST: 28 U/L (ref 10–35)
Albumin: 4.6 g/dL (ref 3.6–5.1)
Alkaline phosphatase (APISO): 62 U/L (ref 35–144)
BUN: 19 mg/dL (ref 7–25)
CO2: 27 mmol/L (ref 20–32)
Calcium: 9.7 mg/dL (ref 8.6–10.3)
Chloride: 101 mmol/L (ref 98–110)
Creat: 1.3 mg/dL (ref 0.70–1.33)
GFR, Est African American: 74 mL/min/{1.73_m2} (ref >=60)
GFR, Est Non African American: 64 mL/min/{1.73_m2} (ref >=60)
Globulin: 2.3 g/dL (calc) (ref 1.9–3.7)
Glucose, Bld: 89 mg/dL (ref 65–99)
Potassium: 4.4 mmol/L (ref 3.5–5.3)
Sodium: 136 mmol/L (ref 135–146)
Total Bilirubin: 1.2 mg/dL (ref 0.2–1.2)
Total Protein: 6.9 g/dL (ref 6.1–8.1)

## 2020-02-25 LAB — HEMOGLOBIN A1C
Hgb A1c MFr Bld: 5.4 % of total Hgb (ref <5.7)
Mean Plasma Glucose: 108 (calc)
eAG (mmol/L): 6 (calc)

## 2020-02-25 LAB — TSH: TSH: 3.46 mIU/L (ref 0.40–4.50)

## 2020-02-25 LAB — MICROALBUMIN / CREATININE URINE RATIO
Creatinine, Urine: 69 mg/dL (ref 20–320)
Microalb Creat Ratio: 4 mcg/mg creat (ref <30)
Microalb, Ur: 0.3 mg/dL

## 2020-02-25 LAB — PSA: PSA: 0.79 ng/mL (ref <=4.0)

## 2020-02-25 LAB — MAGNESIUM: Magnesium: 2.1 mg/dL (ref 1.5–2.5)

## 2020-02-25 LAB — VITAMIN D 25 HYDROXY (VIT D DEFICIENCY, FRACTURES): Vit D, 25-Hydroxy: 35 ng/mL (ref 30–100)

## 2020-03-07 ENCOUNTER — Ambulatory Visit
Admission: RE | Admit: 2020-03-07 | Discharge: 2020-03-07 | Disposition: A | Discharge status: Home / Self Care | Payer: 59 | Source: Ambulatory Visit | Attending: Physician Assistant | Admitting: Physician Assistant

## 2020-03-07 DIAGNOSIS — R9389 Abnormal findings on diagnostic imaging of other specified body structures: Secondary | ICD-10-CM

## 2020-03-09 ENCOUNTER — Encounter: Payer: Self-pay | Admitting: Physician Assistant

## 2020-03-09 DIAGNOSIS — R918 Other nonspecific abnormal finding of lung field: Secondary | ICD-10-CM | POA: Insufficient documentation

## 2020-03-29 ENCOUNTER — Ambulatory Visit: Payer: 59 | Admitting: Adult Health Nurse Practitioner

## 2020-03-29 NOTE — Progress Notes (Deleted)
Complete Physical  Assessment and Plan:  Depression, major, recurrent, in partial remission (HCC) Patient with depression at this time, lack of motivation, some poorly directed frustration primarily at women, no hallucinations, no thoughts of suicide, no plans of harm to others.  Will refer to counseling Discussed go to ER or call office if any thought of self harm or thoughts of harming others.  Close follow up  Continue celexa  Abnormal CT of the chest -     CT Chest Wo Contrast; Future -     QuantiFERON-TB Gold Plus - had abnormal CT- will repeat since he is due, rule out TB  Screening PSA (prostate specific antigen) -     PSA  Screen for colon cancer -     Ambulatory referral to Gastroenterology Had colonoscopy several years ago, unknown who  Screening PSA (prostate specific antigen) -     PSA  BMI 38 - follow up 3 months for progress monitoring - increase veggies, decrease carbs - long discussion about weight loss, diet, and exercise  Essential hypertension - continue medications, DASH diet, exercise and monitor at home. Call if greater than 130/80 - cut benicar in half with weight loss.  -     CBC with Differential/Platelet -     Hepatic function panel -     BASIC METABOLIC PANEL WITH GFR -     TSH -     Urinalysis, Routine w reflex microscopic -     Microalbumin / creatinine urine ratio -     EKG 12-Lead  Hyperlipidemia, unspecified hyperlipidemia type - check lipids off med and with weight loss, decrease fatty foods, increase activity.  -     Lipid panel  Abnormal glucose Discussed general issues about diabetes pathophysiology and management., Educational material distributed., Suggested low cholesterol diet., Encouraged aerobic exercise., Discussed foot care., Reminded to get yearly retinal exam.  Medication management -     Magnesium  Vitamin D deficiency -     VITAMIN D 25 Hydroxy (Vit-D Deficiency, Fractures)  Hypogonadism in male Might be better  with weight loss, will recheck -     Testosterone  Erectile dysfunction, unspecified erectile dysfunction type weight loss advised  Discussed med's effects and SE's. Screening labs and tests as requested with regular follow-up as recommended. Over 40 minutes of exam, counseling, chart review and critical decision making was performed  HPI Patient presents for follow up on medicaiton change to celexa.   His blood pressure has been controlled at home, today their BP is   He does not workout. He denies chest pain, shortness of breath, dizziness.  He was started on humira but states it was not helping so he stopped it.  He has had some depression, he has two step kids that he does not get along with great, has his son that he wishes he could see more often.  He is still on the celexa but has gotten off the wellbutrin. He states he has not have a sex life anymore, he has been being more aroused at the thought of violence. He has been very frustrated with women as well, no plans of hurting women or anyone, just passing thoughts, especially when he is angry. Has been the last year and a half and getting worse.   No headaches, no changes in vision. Not hearing voices, no  Hallucinations. Negative RPR in 2019  BMI is There is no height or weight on file to calculate BMI., he is working on diet and  exercise. Was on phentermine but stopped after syncopal episode in April. During that ER visit he had CTA that showed abnormal Small triangular shaped area of ground-glass opacity in the superior aspect of the right lower lobe and minimal similar densityin the right lower lobe. These are most likely inflammatory in nature.. 6 pack a year smoking hx, quit 1990. No hot flashes.  Wt Readings from Last 3 Encounters:  02/23/20 284 lb (128.8 kg)  09/28/19 245 lb (111.1 kg)  08/24/19 245 lb (111.1 kg)   He is not on cholesterol medication SUPPOSE TO BE ON CRESTOR BUT HE IS NOT   His cholesterol is not at  goal.  Mom with CAD at 75.  6 pack a year smoking hx, quit 1990. The cholesterol last visit was:   Lab Results  Component Value Date   CHOL 226 (H) 02/23/2020   HDL 50 02/23/2020   LDLCALC 142 (H) 02/23/2020   TRIG 198 (H) 02/23/2020   CHOLHDL 4.5 02/23/2020    Last A1C in the office was:  Lab Results  Component Value Date   HGBA1C 5.4 02/23/2020   Last GFR: Lab Results  Component Value Date   GFRNONAA 64 02/23/2020    Patient is on Vitamin D supplement, 4000 IU.   Lab Results  Component Value Date   VD25OH 35 02/23/2020     Last PSA was: Lab Results  Component Value Date   PSA 0.79 02/23/2020   He has a history of testosterone deficiency, he is not on anything at this time.  Lab Results  Component Value Date   TESTOSTERONE 342 02/23/2020    Current Medications:  Current Outpatient Medications on File Prior to Visit  Medication Sig Dispense Refill  . citalopram (CELEXA) 40 MG tablet Take 1 tablet Daily for Mood (Patient taking differently: Take 40 mg by mouth daily. for Mood) 90 tablet 1   No current facility-administered medications on file prior to visit.   Allergies:  No Known Allergies   Health Maintenance:  Immunization History  Administered Date(s) Administered  . Influenza Inj Mdck Quad With Preservative 02/19/2018  . Influenza,inj,quad, With Preservative 03/05/2016  . Tdap 02/19/2018   Health Maintenance  Topic Date Due  . Hepatitis C Screening  Never done  . COVID-19 Vaccine (1) Never done  . HIV Screening  Never done  . COLONOSCOPY  Never done  . INFLUENZA VACCINE  01/09/2020  . TETANUS/TDAP  02/20/2028   Tetanus: 2019 Pneumovax: Prevnar 13: Flu vaccine: 2019 Zostavax:  DEXA: Colonoscopy: DUE 07/2019 will refer- has had before will get paper chart EGD: Echo 2011  Patient Care Team: Lucky Cowboy, MD as PCP - General (Internal Medicine)  Medical History:  has Essential hypertension; Prediabetes; Hyperlipidemia; Medication  management; Hypogonadism in male; Vitamin D deficiency; Erectile dysfunction; Depression, major, recurrent, in partial remission (HCC); and Pulmonary nodules on their problem list. Surgical History:  He  has a past surgical history that includes Foot fracture surgery (1985). Family History:  His family history includes Cancer in his maternal grandmother; Diabetes in his maternal grandfather and mother; Heart disease in his mother; Hypertension in his mother. Social History:   reports that he quit smoking about 29 years ago. He has never used smokeless tobacco. He reports that he does not drink alcohol and does not use drugs.   Review of Systems:  Review of Systems  Constitutional: Negative.   HENT: Negative.   Eyes: Negative.   Respiratory: Negative.   Cardiovascular: Negative.   Gastrointestinal:  Negative.   Genitourinary: Negative.   Musculoskeletal: Negative.   Skin: Negative.   Neurological: Negative.   Endo/Heme/Allergies: Negative.   Psychiatric/Behavioral: Negative.     Physical Exam: Estimated body mass index is 38.52 kg/m as calculated from the following:   Height as of 02/23/20: 6' (1.829 m).   Weight as of 02/23/20: 284 lb (128.8 kg). There were no vitals taken for this visit. General Appearance: Well nourished, in no apparent distress.  Eyes: PERRLA, EOMs, conjunctiva no swelling or erythema, normal fundi and vessels.  Sinuses: No Frontal/maxillary tenderness  ENT/Mouth: Ext aud canals clear, normal light reflex with TMs without erythema, bulging. Good dentition. No erythema, swelling, or exudate on post pharynx. Tonsils not swollen or erythematous. Hearing normal.  Neck: Supple, thyroid normal. No bruits  Respiratory: Respiratory effort normal, BS equal bilaterally without rales, rhonchi, wheezing or stridor.  Cardio: RRR without murmurs, rubs or gallops. Brisk peripheral pulses without edema.  Chest: symmetric, with normal excursions and percussion.  Abdomen: Soft,  nontender, no guarding, rebound, hernias, masses, or organomegaly.  Lymphatics: Non tender without lymphadenopathy.  Genitourinary: defer Musculoskeletal: Full ROM all peripheral extremities,5/5 strength, and normal gait.  Skin: continuing erythematous rash along legs, resolving with back and arms. Warm, dry without rashes, lesions, ecchymosis. Neuro: Cranial nerves intact, reflexes equal bilaterally. Normal muscle tone, no cerebellar symptoms. Sensation intact.  Psych: Awake and oriented X 3, muted/withdrawn affect but will laugh/smile make eye contact at times, Insight and Judgment appropriate.  EKG: just had in ER- will not get this OV- no symptoms AORTA SCAN: defer  Deneane Stifter 9:07 AM [facility]

## 2020-05-26 ENCOUNTER — Other Ambulatory Visit: Payer: Self-pay | Admitting: Internal Medicine

## 2020-05-26 MED ORDER — ALPRAZOLAM 0.5 MG PO TABS: ORAL_TABLET | ORAL | 0 refills | Status: DC

## 2020-06-10 ENCOUNTER — Other Ambulatory Visit: Payer: Self-pay | Admitting: Internal Medicine

## 2020-06-10 DIAGNOSIS — F3341 Major depressive disorder, recurrent, in partial remission: Secondary | ICD-10-CM

## 2020-06-10 MED ORDER — CITALOPRAM HYDROBROMIDE 40 MG PO TABS
ORAL_TABLET | ORAL | 0 refills | Status: DC
Start: 2020-06-10 — End: 2020-09-01

## 2020-08-14 NOTE — Progress Notes (Addendum)
Assessment and Plan:  Heron was seen today for acute visit and collar pain.  Diagnoses and all orders for this visit:  Lung nodule -     CT Chest Wo Contrast; Future 50mm back on 02/2020 Order placed today if stable follow up in 18-39months  Essential hypertension No medications Monitor blood pressure at home; call if consistently over 130/80 Continue DASH diet.   Reminder to go to the ER if any CP, SOB, nausea, dizziness, severe HA, changes vision/speech, left arm numbness and tingling and jaw pain. -     COMPLETE METABOLIC PANEL WITH GFR -     CBC with Differential/Platelet  Acute pain of right shoulder -     meloxicam (MOBIC) 15 MG tablet; Take one daily with food for 2 weeks, can take with tylenol, can not take with aleve, iburpofen, then as needed daily for pain Discussed monitoring symptoms.    Contact office with any new or worsening symptoms.    Further disposition pending results of labs. Discussed med's effects and SE's.   Over 30 minutes of face to face interview, exam, counseling, chart review, and critical decision making was performed.   Future Appointments  Date Time Provider Department Center  08/22/2020 10:40 AM [facility] 1 [facility]  02/26/2021  2:00 PM Riya Huxford, NP [facility] None    ------------------------------------------------------------------------------------------------------------------   HPI 50 y.o.male presents for right shoulder pain that gradually started about 2 months ago.  It is at the shoulder joint that moves, supraclavicular.  It is 5/10 pain, he has tried ice, heat ibuprofen 400mg  twice a day and none of this has helped with his pain.  He is able to sleep through the night, not able to lay on that side.  It is intermittent in nature and he is not able to link this to any actives or time of day.  He is a , denies any increased pain while driving.  Reports he had the vaccine in October, Moderna x2.  Personal  history of COVID 06/2019.He report at the time he was fatigued and body aches that lasted a solid week.  He isn't sure if the should pain is stemming from this.  He has history of left lung nodule, 41mm, incidental finding on CT scan 09/25/19.  Follow up CT 03/09/20 IMPRESSION: 1. Interval development of a left lower lobe 6 mm subsolid subpleural nodule. Several other micronodules likely represent sequela of granulomatous disease in the presence of several calcified granuloma. Non-contrast chest CT at 3-6 months is recommended. If the nodules are stable at time of repeat CT, then future CT at 18-24 months (from today's scan) is considered optional for low-risk patients, but is recommended for high-risk patients. This recommendation follows the consensus statement: Guidelines for Management of Incidental Pulmonary Nodules Detected on CT Images: From the Fleischner Society 2017; Radiology 2017; 284:228-243. 2. Interval resolution of previously identified right upper lobe subsolid nodular opacities.  Order placed today to follow up on this.  Past Medical History:  Diagnosis Date   Anxiety    Depression    HLD (hyperlipidemia)    HTN (hypertension)      No Known Allergies  Current Outpatient Medications on File Prior to Visit  Medication Sig   ALPRAZolam (XANAX) 0.5 MG tablet Take      1/2 - 1 tablet       1 to 2 x /day     ONLY      if needed for Panic or Anxiety Attack &  limit to 5 days /week to avoid Addiction & Dementia   citalopram (CELEXA) 40 MG tablet Take     1 tablet      Daily      for Mood   No current facility-administered medications on file prior to visit.    ROS: all negative except above.   Physical Exam:  BP 126/90    Pulse 87    Temp (!) 97.5 F (36.4 C)    Ht 6' (1.829 m)    Wt 294 lb (133.4 kg)    SpO2 95%    BMI 39.87 kg/m   General Appearance: Well nourished, in no apparent distress. Eyes: PERRLA, EOMs, conjunctiva no swelling or erythema Sinuses:  No Frontal/maxillary tenderness ENT/Mouth: Ext aud canals clear, TMs without erythema, bulging. No erythema, swelling, or exudate on post pharynx.  Tonsils not swollen or erythematous. Hearing normal.  Neck: Supple, thyroid normal.  Respiratory: Respiratory effort normal, BS equal bilaterally without rales, rhonchi, wheezing or stridor.  Cardio: RRR with no MRGs. Brisk peripheral pulses without edema.  Abdomen: Soft, + BS.  Non tender, no guarding, rebound, hernias, masses. Lymphatics: Non tender without lymphadenopathy.  Musculoskeletal: Full ROM, 5/5 strength, normal gait.  Skin: Warm, dry without rashes, lesions, ecchymosis.  Neuro: Cranial nerves intact. Normal muscle tone, no cerebellar symptoms. Sensation intact.  Psych: Awake and oriented X 3, normal affect, Insight and Judgment appropriate.      Elder Negus, Edrick Oh, DNP [facility] 08/15/2020  1:14 PM

## 2020-08-15 ENCOUNTER — Ambulatory Visit: Payer: 59 | Admitting: Adult Health Nurse Practitioner

## 2020-08-15 ENCOUNTER — Encounter: Payer: Self-pay | Admitting: Adult Health Nurse Practitioner

## 2020-08-15 ENCOUNTER — Other Ambulatory Visit: Payer: Self-pay

## 2020-08-15 VITALS — BP 126/90 | HR 87 | Temp 97.5°F | Ht 72.0 in | Wt 294.0 lb

## 2020-08-15 DIAGNOSIS — I10 Essential (primary) hypertension: Secondary | ICD-10-CM

## 2020-08-15 DIAGNOSIS — M25511 Pain in right shoulder: Secondary | ICD-10-CM

## 2020-08-15 DIAGNOSIS — R911 Solitary pulmonary nodule: Secondary | ICD-10-CM

## 2020-08-15 MED ORDER — MELOXICAM 15 MG PO TABS: ORAL_TABLET | ORAL | 1 refills | Status: DC

## 2020-08-16 LAB — COMPLETE METABOLIC PANEL WITH GFR
AG Ratio: 2 (calc) (ref 1.0–2.5)
ALT: 44 U/L (ref 9–46)
AST: 30 U/L (ref 10–35)
Albumin: 4.6 g/dL (ref 3.6–5.1)
Alkaline phosphatase (APISO): 71 U/L (ref 35–144)
BUN: 16 mg/dL (ref 7–25)
CO2: 25 mmol/L (ref 20–32)
Calcium: 9.3 mg/dL (ref 8.6–10.3)
Chloride: 104 mmol/L (ref 98–110)
Creat: 1.26 mg/dL (ref 0.70–1.33)
GFR, Est African American: 76 mL/min/{1.73_m2} (ref >=60)
GFR, Est Non African American: 66 mL/min/{1.73_m2} (ref >=60)
Globulin: 2.3 g/dL (calc) (ref 1.9–3.7)
Glucose, Bld: 84 mg/dL (ref 65–99)
Potassium: 4.4 mmol/L (ref 3.5–5.3)
Sodium: 137 mmol/L (ref 135–146)
Total Bilirubin: 0.8 mg/dL (ref 0.2–1.2)
Total Protein: 6.9 g/dL (ref 6.1–8.1)

## 2020-08-16 LAB — CBC WITH DIFFERENTIAL/PLATELET
Absolute Monocytes: 577 cells/uL (ref 200–950)
Basophils Absolute: 88 cells/uL (ref 0–200)
Basophils Relative: 1.2 %
Eosinophils Absolute: 241 cells/uL (ref 15–500)
Eosinophils Relative: 3.3 %
HCT: 47.6 % (ref 38.5–50.0)
Hemoglobin: 16.5 g/dL (ref 13.2–17.1)
Lymphs Abs: 2161 cells/uL (ref 850–3900)
MCH: 29.4 pg (ref 27.0–33.0)
MCHC: 34.7 g/dL (ref 32.0–36.0)
MCV: 84.8 fL (ref 80.0–100.0)
MPV: 9.9 fL (ref 7.5–12.5)
Monocytes Relative: 7.9 %
Neutro Abs: 4234 cells/uL (ref 1500–7800)
Neutrophils Relative %: 58 %
Platelets: 290 10*3/uL (ref 140–400)
RBC: 5.61 10*6/uL (ref 4.20–5.80)
RDW: 12.6 % (ref 11.0–15.0)
Total Lymphocyte: 29.6 %
WBC: 7.3 10*3/uL (ref 3.8–10.8)

## 2020-08-22 ENCOUNTER — Ambulatory Visit
Admission: RE | Admit: 2020-08-22 | Discharge: 2020-08-22 | Disposition: A | Discharge status: Home / Self Care | Payer: 59 | Source: Ambulatory Visit | Attending: Adult Health Nurse Practitioner | Admitting: Adult Health Nurse Practitioner

## 2020-08-22 DIAGNOSIS — R911 Solitary pulmonary nodule: Secondary | ICD-10-CM

## 2020-09-01 ENCOUNTER — Other Ambulatory Visit: Payer: Self-pay | Admitting: Internal Medicine

## 2020-09-01 DIAGNOSIS — F3341 Major depressive disorder, recurrent, in partial remission: Secondary | ICD-10-CM

## 2020-09-04 ENCOUNTER — Other Ambulatory Visit: Payer: Self-pay | Admitting: Adult Health Nurse Practitioner

## 2020-09-04 DIAGNOSIS — F419 Anxiety disorder, unspecified: Secondary | ICD-10-CM

## 2020-09-04 MED ORDER — ALPRAZOLAM 0.5 MG PO TABS: ORAL_TABLET | ORAL | 2 refills | Status: DC

## 2020-11-21 ENCOUNTER — Other Ambulatory Visit: Payer: Self-pay | Admitting: Adult Health Nurse Practitioner

## 2020-11-21 DIAGNOSIS — F3341 Major depressive disorder, recurrent, in partial remission: Secondary | ICD-10-CM

## 2021-01-05 NOTE — Progress Notes (Signed)
Assessment and Plan:  Bernard Thompson was seen today for acute visit.  Diagnoses and all orders for this visit:  Essential hypertension -     CBC with Differential/Platelet -     hydrochlorothiazide (HYDRODIURIL) 25 MG tablet; Take 1 tablet (25 mg total) by mouth daily. -  continue medications, DASH diet, exercise and monitor at home. Call if greater than 130/80.  Follow up in 2 weeks with blood pressure log to evaluate treatment.  Hyperlipidemia, unspecified hyperlipidemia type -     COMPLETE METABOLIC PANEL WITH GFR -     Lipid panel -     TSH  Depression, major, recurrent, in partial remission (HCC)       - Continue Celexa 40 mg daily and behavior modifications, monitor symptoms  Medication management       - CMP       - CBC       - Lipid Panel       - TSH  Anxiety       - Pt using xanax very sparingly, using relaxation techniques and exercise to help control symptoms.  Severe obesity (BMI 35.0-39.9) with comorbidity (HCC)        - Continue diet and exercise, has lost 30 pounds   Further disposition pending results of labs. Discussed med's effects and SE's.   Over 40 minutes of exam, counseling, chart review, and critical decision making was performed.   Future Appointments  Date Time Provider Department Center  02/26/2021  2:00 PM Revonda Humphrey, NP [facility] None    ------------------------------------------------------------------------------------------------------------------   HPI BP 130/88   Pulse 67   Temp 97.9 F (36.6 C)   Wt 265 lb 9.6 oz (120.5 kg)   SpO2 97%   BMI 36.02 kg/m  50 y.o.male presents for blood pressure concerns Hypertension: Patient is here for evaluation of elevated blood pressures.  Age at onset of elevated blood pressure:  .Cardiac symptoms none. Patient denies Chest pain, shortness of breath, headaches, visual changes.  Cardiovascular risk factors: dyslipidemia. Use of agents associated with hypertension: none. History of target organ damage:  none. BP at home is running 150's/90-100 BP Readings from Last 3 Encounters:  01/08/21 130/88  08/15/20 126/90  02/23/20 128/88    Hyperlipidemia: He is not on cholesterol medication and his last cholesterol was not at goal. Had been on pravastatin and rosuvastatin in the past but is not currently on any medication.  Has been trying to bring it down with diet.  Lab Results  Component Value Date   CHOL 226 (H) 02/23/2020   HDL 50 02/23/2020   LDLCALC 142 (H) 02/23/2020   TRIG 198 (H) 02/23/2020   CHOLHDL 4.5 02/23/2020      Obesity: BMI is Body mass index is 36.02 kg/m., he has been working on diet .  Pt has been eating more vegetables, very little red meat, no alt and increased water consumption. Wt Readings from Last 3 Encounters:  01/08/21 265 lb 9.6 oz (120.5 kg)  08/15/20 294 lb (133.4 kg)  02/23/20 284 lb (128.8 kg)   Depression/Anxiety: He is currently taking Celexa 40 mg daily and Xanax 0.5mg  1/2 tab PRN for sleep, panic attacks.  His anxiety was much higher last week due to work stressors but is improved currently.  Does not use his Xanax during the week because he is a [employer]. Past Medical History:  Diagnosis Date   Anxiety    Depression    HLD (hyperlipidemia)    HTN (hypertension)  No Known Allergies  Current Outpatient Medications on File Prior to Visit  Medication Sig   ALPRAZolam (XANAX) 0.5 MG tablet TAKE 1/2 TO 1 TABLET 1-2 TIMES DAILY ONLY AS NEEDED FOR PANIC OR ANXIETY ATTACK. LIMIT TO 5 DAYS/WEEK TO AVOID ADDICTION   citalopram (CELEXA) 40 MG tablet Take  1 tablet  Daily for Mood   meloxicam (MOBIC) 15 MG tablet Take one daily with food for 2 weeks, can take with tylenol, can not take with aleve, iburpofen, then as needed daily for pain   No current facility-administered medications on file prior to visit.    Review of Systems  Constitutional:  Negative for chills, fever and weight loss.  HENT:  Negative for congestion and hearing loss.    Eyes:  Negative for blurred vision and double vision.  Respiratory:  Negative for cough, shortness of breath and wheezing.   Cardiovascular:  Negative for chest pain, palpitations, orthopnea and leg swelling.  Gastrointestinal:  Negative for abdominal pain, constipation, diarrhea, heartburn, nausea and vomiting.  Musculoskeletal:  Negative for falls, joint pain and myalgias.  Skin:  Negative for rash.  Neurological:  Negative for dizziness, tingling, tremors, loss of consciousness and headaches.  Psychiatric/Behavioral:  Negative for depression, memory loss and suicidal ideas. The patient is nervous/anxious and has insomnia.     Physical Exam:  BP 130/88   Pulse 67   Temp 97.9 F (36.6 C)   Wt 265 lb 9.6 oz (120.5 kg)   SpO2 97%   BMI 36.02 kg/m   General Appearance: Well nourished, in no apparent distress. Eyes: PERRLA, EOMs, conjunctiva no swelling or erythema Sinuses: No Frontal/maxillary tenderness ENT/Mouth: Ext aud canals clear, TMs without erythema, bulging. No erythema, swelling, or exudate on post pharynx.  Tonsils not swollen or erythematous. Hearing normal.  Neck: Supple, thyroid normal.  Respiratory: Respiratory effort normal, BS equal bilaterally without rales, rhonchi, wheezing or stridor.  Cardio: RRR with no MRGs. Brisk peripheral pulses without edema.  Abdomen: Soft, + BS.  Non tender, no guarding, rebound, hernias, masses. Lymphatics: Non tender without lymphadenopathy.  Musculoskeletal: Full ROM, 5/5 strength, normal gait.  Skin: Warm, dry without rashes, lesions, ecchymosis.  Neuro: Cranial nerves intact. Normal muscle tone, no cerebellar symptoms. Sensation intact.  Psych: Awake and oriented X 3, normal affect, Insight and Judgment appropriate.     Revonda Humphrey, NP 4:01 PM [facility]

## 2021-01-08 ENCOUNTER — Ambulatory Visit: Payer: 59 | Admitting: Nurse Practitioner

## 2021-01-08 ENCOUNTER — Other Ambulatory Visit: Payer: Self-pay

## 2021-01-08 ENCOUNTER — Encounter: Payer: Self-pay | Admitting: Nurse Practitioner

## 2021-01-08 VITALS — BP 130/88 | HR 67 | Temp 97.9°F | Wt 265.6 lb

## 2021-01-08 DIAGNOSIS — F3341 Major depressive disorder, recurrent, in partial remission: Secondary | ICD-10-CM

## 2021-01-08 DIAGNOSIS — Z6838 Body mass index (BMI) 38.0-38.9, adult: Secondary | ICD-10-CM

## 2021-01-08 DIAGNOSIS — E785 Hyperlipidemia, unspecified: Secondary | ICD-10-CM | POA: Diagnosis not present

## 2021-01-08 DIAGNOSIS — I10 Essential (primary) hypertension: Secondary | ICD-10-CM | POA: Diagnosis not present

## 2021-01-08 DIAGNOSIS — Z79899 Other long term (current) drug therapy: Secondary | ICD-10-CM

## 2021-01-08 DIAGNOSIS — F419 Anxiety disorder, unspecified: Secondary | ICD-10-CM

## 2021-01-08 LAB — CBC WITH DIFFERENTIAL/PLATELET
Absolute Monocytes: 576 cells/uL (ref 200–950)
Basophils Absolute: 50 cells/uL (ref 0–200)
Basophils Relative: 0.7 %
Eosinophils Absolute: 238 cells/uL (ref 15–500)
Eosinophils Relative: 3.3 %
HCT: 46.9 % (ref 38.5–50.0)
Hemoglobin: 16 g/dL (ref 13.2–17.1)
Lymphs Abs: 1836 cells/uL (ref 850–3900)
MCH: 28.6 pg (ref 27.0–33.0)
MCHC: 34.1 g/dL (ref 32.0–36.0)
MCV: 83.9 fL (ref 80.0–100.0)
MPV: 9.6 fL (ref 7.5–12.5)
Monocytes Relative: 8 %
Neutro Abs: 4500 cells/uL (ref 1500–7800)
Neutrophils Relative %: 62.5 %
Platelets: 273 10*3/uL (ref 140–400)
RBC: 5.59 10*6/uL (ref 4.20–5.80)
RDW: 12.4 % (ref 11.0–15.0)
Total Lymphocyte: 25.5 %
WBC: 7.2 10*3/uL (ref 3.8–10.8)

## 2021-01-08 LAB — LIPID PANEL
Cholesterol: 211 mg/dL — ABNORMAL HIGH (ref <200)
HDL: 52 mg/dL (ref >=40)
LDL Cholesterol (Calc): 139 mg/dL (calc) — ABNORMAL HIGH
Non-HDL Cholesterol (Calc): 159 mg/dL (calc) — ABNORMAL HIGH (ref <130)
Total CHOL/HDL Ratio: 4.1 (calc) (ref <5.0)
Triglycerides: 102 mg/dL (ref <150)

## 2021-01-08 LAB — COMPLETE METABOLIC PANEL WITH GFR
AG Ratio: 2 (calc) (ref 1.0–2.5)
ALT: 38 U/L (ref 9–46)
AST: 23 U/L (ref 10–35)
Albumin: 4.5 g/dL (ref 3.6–5.1)
Alkaline phosphatase (APISO): 85 U/L (ref 35–144)
BUN: 19 mg/dL (ref 7–25)
CO2: 24 mmol/L (ref 20–32)
Calcium: 9.7 mg/dL (ref 8.6–10.3)
Chloride: 107 mmol/L (ref 98–110)
Creat: 1.24 mg/dL (ref 0.70–1.30)
Globulin: 2.2 g/dL (calc) (ref 1.9–3.7)
Glucose, Bld: 88 mg/dL (ref 65–99)
Potassium: 4.6 mmol/L (ref 3.5–5.3)
Sodium: 140 mmol/L (ref 135–146)
Total Bilirubin: 0.6 mg/dL (ref 0.2–1.2)
Total Protein: 6.7 g/dL (ref 6.1–8.1)
eGFR: 70 mL/min/{1.73_m2} (ref >=60)

## 2021-01-08 LAB — TSH: TSH: 1.79 mIU/L (ref 0.40–4.50)

## 2021-01-08 MED ORDER — HYDROCHLOROTHIAZIDE 25 MG PO TABS: 25.0000 mg | ORAL_TABLET | Freq: Every day | ORAL | 0 refills | Status: DC

## 2021-01-08 NOTE — Patient Instructions (Addendum)
Keep blood pressure log once a day and bring with you at follow up.  Start exercise with walking 20-30 minutes a day  Hydrochlorothiazide Capsules or Tablets What is this medication? HYDROCHLOROTHIAZIDE (hye droe klor oh THYE a zide) treats high blood pressure. It may also be used to reduce swelling related to heart, kidney, or liver disease. It helps your kidneys remove more fluid and salt from your bloodthrough the urine. It belongs to a group of medications called diuretics. This medicine may be used for other purposes; ask your health care provider orpharmacist if you have questions. COMMON BRAND NAME(S): Esidrix, Ezide, HydroDIURIL, Microzide, Oretic, Zide What should I tell my care team before I take this medication? They need to know if you have any of these conditions: Diabetes Gout Kidney disease Liver disease Lupus Pancreatitis An unusual or allergic reaction to hydrochlorothiazide, sulfa drugs, other medications, foods, dyes, or preservatives Pregnant or trying to get pregnant Breast-feeding How should I use this medication? Take this medication by mouth. Take it as directed on the prescription label at the same time every day. You can take it with or without food. If it upsets your stomach, take it with food. Keep taking it unless your care team tells youto stop. Talk to your care team about the use of this medication in children. While it may be prescribed for children as young as newborns for selected conditions,precautions do apply. Overdosage: If you think you have taken too much of this medicine contact apoison control center or emergency room at once. NOTE: This medicine is only for you. Do not share this medicine with others. What if I miss a dose? If you miss a dose, take it as soon as you can. If it is almost time for yournext dose, take only that dose. Do not take double or extra doses. What may interact with this  medication? Cholestyramine Colestipol Digoxin Dofetilide Lithium Medications for blood pressure Medications for diabetes Medications that relax muscles for surgery Other diuretics Steroid medications like prednisone or cortisone This list may not describe all possible interactions. Give your health care provider a list of all the medicines, herbs, non-prescription drugs, or dietary supplements you use. Also tell them if you smoke, drink alcohol, or use illegaldrugs. Some items may interact with your medicine. What should I watch for while using this medication? Visit your health care provider for regular check-ups. Check your blood pressure as directed. Ask your health care provider what your blood pressureshould be. Also, find out when you should contact him or her. Do not treat yourself for coughs, colds, or pain while you are using this medication without asking your health care provider for advice. Somemedications may increase your blood pressure. You may get drowsy or dizzy. Do not drive, use machinery, or do anything that needs mental alertness until you know how this medication affects you. Do not stand or sit up quickly, especially if you are an older patient. This reduces the risk of dizzy or fainting spells. Alcohol can make you more drowsy anddizzy. Avoid alcoholic drinks. Talk to your health care professional about your risk of skin cancer. You maybe more at risk for skin cancer if you take this medication. This medication can make you more sensitive to the sun. Keep out of the sun. If you cannot avoid being in the sun, wear protective clothing and use sunscreen.Do not use sun lamps or tanning beds/booths. You may need to be on a special diet while taking this medication. Ask your  health care provider. Also, find out how many glasses of fluids you need todrink each day. Check with your health care provider if you get an attack of severe diarrhea, nausea and vomiting, or if you sweat a  lot. The loss of too much body fluid canmake it dangerous for you to take this medication. This medication may increase blood sugar. Ask your healthcare provider ifchanges in diet or medications are needed if you have diabetes. What side effects may I notice from receiving this medication? Side effects that you should report to your care team as soon as possible: Allergic reactions-skin rash, itching, hives, swelling of the face, lips, tongue, or throat Dehydration-increased thirst, dry mouth, feeling faint or lightheaded, headache, dark yellow or brown urine Gout-severe pain, redness, warmth, or swelling in joints, such as the big toe Kidney injury-decrease in the amount of urine, swelling of the ankles, hands, or feet Low blood pressure-dizziness, feeling faint or lightheaded, blurry vision Low potassium level-muscle pain or cramps, unusual weakness, fatigue, fast or irregular heartbeat, constipation Sudden eye pain or change in vision such as blurred vision, seeing halos around lights, vision loss Side effects that usually do not require medical attention (report to your careteam if they continue or are bothersome): Change in sex drive or performance Headache Upset stomach This list may not describe all possible side effects. Call your doctor for medical advice about side effects. You may report side effects to FDA at1-800-FDA-1088. Where should I keep my medication? Keep out of the reach of children and pets. Store at room temperature between 20 and 25 degrees C (68 and 77 degrees F). Protect from light and moisture. Keep the container tightly closed. Do notfreeze. Get rid of any unused medication after the expiration date. To get rid of medications that are no longer needed or have expired: Take the medication to a medication take-back program. Check with your pharmacy or law enforcement to find a location. If you cannot return the medication, check the label or package insert to see if the  medication should be thrown out in the garbage or flushed down the toilet. If you are not sure, ask your care team. If it is safe to put in the trash, empty the medication out of the container. Mix the medication with cat litter, dirt, coffee grounds, or other unwanted substance. Seal the mixture in a bag or container. Put it in the trash. NOTE: This sheet is a summary. It may not cover all possible information. If you have questions about this medicine, talk to your doctor, pharmacist, orhealth care provider.  2022 Elsevier/Gold Standard (2020-06-23 12:36:20)

## 2021-01-18 NOTE — Progress Notes (Addendum)
Assessment and Plan:  Bernard Thompson was seen today for 2 week follow-up.  Diagnoses and all orders for this visit:  Essential hypertension Stop HCTZ as no benefit Initiate medication: losartan 50 mg daily Discussed stress reduction - likely contributing significantly Consider BB if no improvement with ARB Monitor blood pressure at home; call if consistently over 130/80 Discussed DASH diet Advised to go to the ER if any CP, SOB, nausea, dizziness, severe HA, changes vision/speech, left arm numbness and tingling and jaw pain. Follow up in 1 month as scheduled or sooner if concerns or lack of improvement -     losartan (COZAAR) 50 MG tablet; Take 1 tab daily for blood pressure goal <130/80.  Depression, major, recurrent, moderate (HCC) Insomnia, unspecified type Element of mood/possible shift sleep  Stress management techniques discussed, increase water, good sleep hygiene discussed, increase exercise, and increase veggies.  - good sleep hygiene discussed, increase day time activity, try melatonin or benadryl if this does not help start new medication as prescribed - discussed likely benefit from CBT, he will consider setting this up  Follow up 1 month, call the office if any new AE's from medications and we will switch them -     traZODone (DESYREL) 50 MG tablet; 1/2-1 tablet for sleep  Further disposition pending results of labs. Discussed med's effects and SE's.   Over 30 minutes of exam, counseling, chart review, and critical decision making was performed.   Future Appointments  Date Time Provider Department Center  02/26/2021  2:00 PM Bernard Humphrey, NP [facility] None    ------------------------------------------------------------------------------------------------------------------   HPI BP (!) 132/94   Pulse 72   Temp 97.7 F (36.5 C)   Wt 266 lb 3.2 oz (120.7 kg)   SpO2 96%   BMI 36.10 kg/m  50 y.o.male with htn hld and obesity presents for 2 week follow up on htn. He was  started on HCTZ 25 mg at last visit. He has not noted any benefit with BP since staring this med, reports 140s/90s at home.   He reports is watching diet, denies alcohol intake or smoking.  He does report chronic stress r/t wife's Bipolar dx and short staffed for quite some time at work, hasn't taken vacation in over 5 years. Gets about 5 hours of sleep, struggling more in last few months since switching to day shift, more anxiety tyically related to work. Has taken celexa for many years for MDD; he has xanax 0.5 mg PRN but doesn't like to take. Hasn't tried anything for sleep.  BP Readings from Last 3 Encounters:  01/22/21 (!) 132/94  01/08/21 130/88  08/15/20 126/90   BMI is Body mass index is 36.1 kg/m., he has been working on diet and exercise. Wt Readings from Last 3 Encounters:  01/22/21 266 lb 3.2 oz (120.7 kg)  01/08/21 265 lb 9.6 oz (120.5 kg)  08/15/20 294 lb (133.4 kg)      Past Medical History:  Diagnosis Date   Anxiety    Depression    HLD (hyperlipidemia)    HTN (hypertension)      No Known Allergies  Current Outpatient Medications on File Prior to Visit  Medication Sig   ALPRAZolam (XANAX) 0.5 MG tablet TAKE 1/2 TO 1 TABLET 1-2 TIMES DAILY ONLY AS NEEDED FOR PANIC OR ANXIETY ATTACK. LIMIT TO 5 DAYS/WEEK TO AVOID ADDICTION   citalopram (CELEXA) 40 MG tablet Take  1 tablet  Daily for Mood   No current facility-administered medications on file prior to  visit.    ROS: all negative except above.   Physical Exam:  BP (!) 132/94   Pulse 72   Temp 97.7 F (36.5 C)   Wt 266 lb 3.2 oz (120.7 kg)   SpO2 96%   BMI 36.10 kg/m   General Appearance: Well nourished, in no apparent distress. Eyes: PERRLA, EOMs, conjunctiva no swelling or erythema Sinuses: No Frontal/maxillary tenderness ENT/Mouth: Ext aud canals clear, TMs without erythema, bulging. No erythema, swelling, or exudate on post pharynx.  Tonsils not swollen or erythematous. Hearing normal.  Neck:  Supple, thyroid normal.  Respiratory: Respiratory effort normal, BS equal bilaterally without rales, rhonchi, wheezing or stridor.  Cardio: RRR with no MRGs. Brisk peripheral pulses without edema.  Abdomen: Soft, + BS.  Non tender, no guarding, rebound, hernias, masses. Lymphatics: Non tender without lymphadenopathy.  Musculoskeletal: Full ROM, 5/5 strength, normal gait.  Skin: Warm, dry without rashes, lesions, ecchymosis.  Neuro: Cranial nerves intact. Normal muscle tone, no cerebellar symptoms. Sensation intact.  Psych: Awake and oriented X 3, depressed affect, Insight and Judgment appropriate.     Dan Maker, NP 2:16 PM [facility]

## 2021-01-22 ENCOUNTER — Other Ambulatory Visit: Payer: Self-pay

## 2021-01-22 ENCOUNTER — Encounter: Payer: Self-pay | Admitting: Adult Health

## 2021-01-22 ENCOUNTER — Ambulatory Visit: Payer: 59 | Admitting: Adult Health

## 2021-01-22 VITALS — BP 132/94 | HR 72 | Temp 97.7°F | Wt 266.2 lb

## 2021-01-22 DIAGNOSIS — I10 Essential (primary) hypertension: Secondary | ICD-10-CM | POA: Diagnosis not present

## 2021-01-22 DIAGNOSIS — F331 Major depressive disorder, recurrent, moderate: Secondary | ICD-10-CM | POA: Diagnosis not present

## 2021-01-22 DIAGNOSIS — Z79899 Other long term (current) drug therapy: Secondary | ICD-10-CM

## 2021-01-22 DIAGNOSIS — G4700 Insomnia, unspecified: Secondary | ICD-10-CM | POA: Diagnosis not present

## 2021-01-22 MED ORDER — LOSARTAN POTASSIUM 50 MG PO TABS: ORAL_TABLET | ORAL | 0 refills | Status: DC

## 2021-01-22 MED ORDER — TRAZODONE HCL 50 MG PO TABS: ORAL_TABLET | ORAL | 2 refills | Status: DC

## 2021-01-22 NOTE — Patient Instructions (Signed)
CBT or cognitive behavioral therapy - check with insurance    Can try melatonin 5mg -15 mg at night for sleep, can also do benadryl 25-50mg  at night for sleep.  If this does not help we can try prescription medication.  Also here is some information about good sleep hygiene.     Insomnia Insomnia is frequent trouble falling and/or staying asleep. Insomnia can be a long term problem or a short term problem. Both are common. Insomnia can be a short term problem when the wakefulness is related to a certain stress or worry. Long term insomnia is often related to ongoing stress during waking hours and/or poor sleeping habits. Overtime, sleep deprivation itself can make the problem worse. Every little thing feels more severe because you are overtired and your ability to cope is decreased. CAUSES  Stress, anxiety, and depression. Poor sleeping habits. Distractions such as TV in the bedroom. Naps close to bedtime. Engaging in emotionally charged conversations before bed. Technical reading before sleep. Alcohol and other sedatives. They may make the problem worse. They can hurt normal sleep patterns and normal dream activity. Stimulants such as caffeine for several hours prior to bedtime. Pain syndromes and shortness of breath can cause insomnia. Exercise late at night. Changing time zones may cause sleeping problems (jet lag). It is sometimes helpful to have someone observe your sleeping patterns. They should look for periods of not breathing during the night (sleep apnea). They should also look to see how long those periods last. If you live alone or observers are uncertain, you can also be observed at a sleep clinic where your sleep patterns will be professionally monitored. Sleep apnea requires a checkup and treatment. Give your caregivers your medical history. Give your caregivers observations your family has made about your sleep.  SYMPTOMS  Not feeling rested in the morning. Anxiety and  restlessness at bedtime. Difficulty falling and staying asleep. TREATMENT  Your caregiver may prescribe treatment for an underlying medical disorders. Your caregiver can give advice or help if you are using alcohol or other drugs for self-medication. Treatment of underlying problems will usually eliminate insomnia problems. Medications can be prescribed for short time use. They are generally not recommended for lengthy use. Over-the-counter sleep medicines are not recommended for lengthy use. They can be habit forming. You can promote easier sleeping by making lifestyle changes such as: Using relaxation techniques that help with breathing and reduce muscle tension. Exercising earlier in the day. Changing your diet and the time of your last meal. No night time snacks. Establish a regular time to go to bed. Counseling can help with stressful problems and worry. Soothing music and white noise may be helpful if there are background noises you cannot remove. Stop tedious detailed work at least one hour before bedtime. HOME CARE INSTRUCTIONS  Keep a diary. Inform your caregiver about your progress. This includes any medication side effects. See your caregiver regularly. Take note of: Times when you are asleep. Times when you are awake during the night. The quality of your sleep. How you feel the next day. This information will help your caregiver care for you. Get out of bed if you are still awake after 15 minutes. Read or do some quiet activity. Keep the lights down. Wait until you feel sleepy and go back to bed. Keep regular sleeping and waking hours. Avoid naps. Exercise regularly. Avoid distractions at bedtime. Distractions include watching television or engaging in any intense or detailed activity like attempting to balance the household  checkbook. Develop a bedtime ritual. Keep a familiar routine of bathing, brushing your teeth, climbing into bed at the same time each night, listening to  soothing music. Routines increase the success of falling to sleep faster. Use relaxation techniques. This can be using breathing and muscle tension release routines. It can also include visualizing peaceful scenes. You can also help control troubling or intruding thoughts by keeping your mind occupied with boring or repetitive thoughts like the old concept of counting sheep. You can make it more creative like imagining planting one beautiful flower after another in your backyard garden. During your day, work to eliminate stress. When this is not possible use some of the previous suggestions to help reduce the anxiety that accompanies stressful situations. MAKE SURE YOU:  Understand these instructions. Will watch your condition. Will get help right away if you are not doing well or get worse. Document Released: 05/24/2000 Document Revised: 08/19/2011 Document Reviewed: 06/24/2007 [facility] Patient Information 2015 [employer], [place]. This information is not intended to replace advice given to you by your health care provider. Make sure you discuss any questions you have with your health care provider.      Trazodone Tablets What is this medication? TRAZODONE (TRAZ oh done) treats depression. It increases the amount ofserotonin in the brain, a hormone that helps regulate mood. This medicine may be used for other purposes; ask your health care provider orpharmacist if you have questions. COMMON BRAND NAME(S): Desyrel What should I tell my care team before I take this medication? They need to know if you have any of these conditions: Attempted suicide or thinking about it Bipolar disorder Bleeding problems Glaucoma Heart disease, or previous heart attack Irregular heart beat Kidney or liver disease Low levels of sodium in the blood An unusual or allergic reaction to trazodone, other medications, foods, dyes or preservatives Pregnant or trying to get pregnant Breast-feeding How should I use  this medication? Take this medication by mouth with a glass of water. Follow the directions on the prescription label. Take this medication shortly after a meal or a light snack. Take your medication at regular intervals. Do not take your medication more often than directed. Do not stop taking this medication suddenly except upon the advice of your care team. Stopping this medication too quickly maycause serious side effects or your condition may worsen. A special MedGuide will be given to you by the pharmacist with eachprescription and refill. Be sure to read this information carefully each time. Talk to your care team regarding the use of this medication in children.Special care may be needed. Overdosage: If you think you have taken too much of this medicine contact apoison control center or emergency room at once. NOTE: This medicine is only for you. Do not share this medicine with others. What if I miss a dose? If you miss a dose, take it as soon as you can. If it is almost time for yournext dose, take only that dose. Do not take double or extra doses. What may interact with this medication? Do not take this medication with any of the following: Certain medications for fungal infections like fluconazole, itraconazole, ketoconazole, posaconazole, voriconazole Cisapride Dronedarone Linezolid MAOIs like Carbex, Eldepryl, Marplan, Nardil, and Parnate Mesoridazine Methylene blue (injected into a vein) Pimozide Saquinavir Thioridazine This medication may also interact with the following: Alcohol Antiviral medications for HIV or AIDS Aspirin and aspirin-like medications Barbiturates like phenobarbital Certain medications for blood pressure, heart disease, irregular heart beat Certain medications for depression,  anxiety, or psychotic disturbances Certain medications for migraine headache like almotriptan, eletriptan, frovatriptan, naratriptan, rizatriptan, sumatriptan, zolmitriptan Certain  medications for seizures like carbamazepine and phenytoin Certain medications for sleep Certain medications that treat or prevent blood clots like dalteparin, enoxaparin, warfarin Digoxin Fentanyl Lithium NSAIDS, medications for pain and inflammation, like ibuprofen or naproxen Other medications that prolong the QT interval (cause an abnormal heart rhythm) like dofetilide Rasagiline Supplements like St. John's wort, kava kava, valerian Tramadol Tryptophan This list may not describe all possible interactions. Give your health care provider a list of all the medicines, herbs, non-prescription drugs, or dietary supplements you use. Also tell them if you smoke, drink alcohol, or use illegaldrugs. Some items may interact with your medicine. What should I watch for while using this medication? Tell your care team if your symptoms do not get better or if they get worse. Visit your care team for regular checks on your progress. Because it may take several weeks to see the full effects of this medication, it is important tocontinue your treatment as prescribed by your care team. Watch for new or worsening thoughts of suicide or depression. This includes sudden changes in mood, behaviors, or thoughts. These changes can happen at any time but are more common in the beginning of treatment or after a change in dose. Call your care team right away if you experience these thoughts orworsening depression. Manic episodes may happen in patients with bipolar disorder who take this medication. Watch for changes in feelings or behaviors such as feeling anxious, nervous, agitated, panicky, irritable, hostile, aggressive, impulsive, severely restless, overly excited and hyperactive, or trouble sleeping. These changes can happen at any time but are more common in the beginning of treatment or after a change in dose. Call your care team right away if you notice any ofthese symptoms. You may get drowsy or dizzy. Do not  drive, use machinery, or do anything that needs mental alertness until you know how this medication affects you. Do not stand or sit up quickly, especially if you are an older patient. This reduces the risk of dizzy or fainting spells. Alcohol may interfere with the effect ofthis medication. Avoid alcoholic drinks. This medication may cause dry eyes and blurred vision. If you wear contact lenses you may feel some discomfort. Lubricating drops may help. See your eyedoctor if the problem does not go away or is severe. Your mouth may get dry. Chewing sugarless gum, sucking hard candy and drinking plenty of water may help. Contact your care team if the problem does not goaway or is severe. What side effects may I notice from receiving this medication? Side effects that you should report to your care team as soon as possible: Allergic reactions-skin rash, itching, hives, swelling of the face, lips, tongue, or throat Bleeding-bloody or black, tar-like stools, red or dark brown urine, vomiting blood or brown material that looks like coffee grounds, small, red or purple spots on skin, unusual bleeding or bruising Heart rhythm changes-fast or irregular heartbeat, dizziness, feeling faint or lightheaded, chest pain, trouble breathing Low blood pressure-dizziness, feeling faint or lightheaded, blurry vision Low sodium level-muscle weakness, fatigue, dizziness, headache, confusion Prolonged or painful erection Serotonin syndrome-irritability, confusion, fast or irregular heartbeat, muscle stiffness, twitching muscles, sweating, high fever, seizures, chills, vomiting, diarrhea Sudden eye pain or change in vision such as blurry vision, seeing halos around lights, vision loss Thoughts of suicide or self-harm, worsening mood, feelings of depression Side effects that usually do not require medical attention (  report to your careteam if they continue or are bothersome): Change in sex drive or  performance Constipation Dizziness Drowsiness Dry mouth This list may not describe all possible side effects. Call your doctor for medical advice about side effects. You may report side effects to FDA at1-800-FDA-1088. Where should I keep my medication? Keep out of the reach of children and pets. Store at room temperature between 15 and 30 degrees C (59 to 86 degrees F). Protect from light. Keep container tightly closed. Throw away any unusedmedication after the expiration date. NOTE: This sheet is a summary. It may not cover all possible information. If you have questions about this medicine, talk to your doctor, pharmacist, orhealth care provider.  2022 Elsevier/Gold Standard (2020-04-17 14:46:11)

## 2021-02-21 ENCOUNTER — Other Ambulatory Visit: Payer: Self-pay | Admitting: Adult Health

## 2021-02-21 DIAGNOSIS — I10 Essential (primary) hypertension: Secondary | ICD-10-CM

## 2021-02-23 NOTE — Progress Notes (Signed)
Complete Physical  Assessment and Plan: Bernard Thompson was seen today for annual exam.  Diagnoses and all orders for this visit:  Essential hypertension -     CBC with Differential/Platelet - - continue medications, DASH diet, exercise and monitor at home. Call if greater than 130/80.    Hyperlipidemia, unspecified hyperlipidemia type -     COMPLETE METABOLIC PANEL WITH GFR - Currently on no medication, discussed restarting Crestor if Lipids remain elevated and pt is amenable  Depression, major, recurrent, in partial remission (HCC) -     venlafaxine XR (EFFEXOR XR) 75 MG 24 hr capsule; Take 1 capsule (75 mg total) by mouth daily. - Patient with depression at this time, lack of motivation, some poorly directed frustration primarily at women, no hallucinations, no thoughts of suicide, no plans of harm to others.  Will refer to counseling Discussed go to ER or call office if any thought of self harm or thoughts of harming others.  Pt to follow up in 2 months to reevaluate or sooner if needed Will stop Celexa and switch to Effexor.   Severe obesity (BMI 35.0-39.9) with comorbidity (HCC)  Discussed importance of diet and exercise  Insomnia, unspecified type  Continue Trazodone and practicing good sleep behaviors  Abnormal glucose -     Hemoglobin A1c - Stressed importance of diet and exercise  Abnormal CT of the chest  -     Chest CT 08/22/20, Stable small bilateral pulmonary nodules. The dominant pulmonary nodule is located in the left lower lobe and measures approximately 6 mm. A 12-18 month follow-up CT of the chest is recommended to confirm ongoing stability  Screening PSA (prostate specific antigen) -     PSA  Erectile dysfunction, unspecified erectile dysfunction type  Testosterone  Vitamin D deficiency -     VITAMIN D 25 Hydroxy (Vit-D Deficiency, Fractures)  Medication management -     CBC with Differential/Platelet -     COMPLETE METABOLIC PANEL WITH GFR -      Magnesium -     Hemoglobin A1c -     VITAMIN D 25 Hydroxy (Vit-D Deficiency, Fractures) -     PSA -     Testosterone -     Urinalysis, Routine w reflex microscopic -     Microalbumin / creatinine urine ratio -     EKG 12-Lead  Screening for ischemic heart disease -     EKG 12-Lead  Screening for AAA (abdominal aortic aneurysm) -     [place], RETROPERITNL ABD,  LTD  Screening for hematuria or proteinuria -     Urinalysis, Routine w reflex microscopic -     Microalbumin / creatinine urine ratio  Screening for colon cancer -     Cologuard  Elbow pain, chronic, right -     MR ELBOW RIGHT WO CONTRAST; Future - Xrays showed no fracture at [facility] - Mobic 7.5mg  QD as needed  - Alternate ice and heat, rest as much as possible          Discussed med's effects and SE's. Screening labs and tests as requested with regular follow-up as recommended. Over 40 minutes of exam, counseling, chart review and critical decision making was performed  HPI Patient presents for a complete physical.   His blood pressure has been controlled at home, today their BP is BP: 128/84 He does not workout. He denies chest pain, shortness of breath, dizziness.  He was started on humira but states it was not helping so he  stopped it.  On 01/31/21 Pt was seen in [place] [facility]. Right elbow pain / injury: 50 y.o. male, comes in today with reports of intermittent lateral right elbow pain over the past 3 months. Reports that one of his dogs jumped up and hit his elbow 3 months ago. He has been having intermittent pain at the lateral aspect of the right elbow since that time, worse with palpation, worse with repetitive motion of the elbow. No redness or swelling or bruising or rash or skin break at site of pain. No numbness or weakness or tingling in affected extremity. No history of elbow injury or surgery. No similar pain elsewhere. Has not taken any medications for symptoms Xrays were  taken but showed no fractures.  Pain is worsening in right elbow   He does have a worsening of depression, he has two step kids that he does not get along with great, has his son that he wishes he could see more often.  He is still on the celexa but not working well. He has no joy in things.  No thought of hurting himself or others. Has never seen a psychiatrist or therapist  No headaches, no changes in vision. Not hearing voices, no  Hallucinations. Negative RPR in 2019   During a ER visit he had CTA that showed abnormal Small triangular shaped area of ground-glass opacity in the superior aspect of the right lower lobe and minimal similar densityin the right lower lobe. These are most likely inflammatory in nature.. 6 pack a year smoking hx, quit 1990.  BMI is Body mass index is 36.7 kg/m., he is working on diet and exercise.Has stopped exercise and diet due to depression No hot flashes.  Wt Readings from Last 3 Encounters:  02/26/21 270 lb 9.6 oz (122.7 kg)  01/22/21 266 lb 3.2 oz (120.7 kg)  01/08/21 265 lb 9.6 oz (120.5 kg)   He is not on cholesterol medication  His cholesterol is not at goal.  Mom with CAD at 75.  6 pack a year smoking hx, quit 1990. The cholesterol last visit was:   Lab Results  Component Value Date   CHOL 211 (H) 01/08/2021   HDL 52 01/08/2021   LDLCALC 139 (H) 01/08/2021   TRIG 102 01/08/2021   CHOLHDL 4.1 01/08/2021    Last A1C in the office was:  Lab Results  Component Value Date   HGBA1C 5.4 02/23/2020   Last GFR: Lab Results  Component Value Date   GFRNONAA 66 08/15/2020    Patient is on Vitamin D supplement, 4000 IU.   Lab Results  Component Value Date   VD25OH 35 02/23/2020     Last PSA was: Lab Results  Component Value Date   PSA 0.79 02/23/2020   He has a history of testosterone deficiency, he is not on anything at this time.  Lab Results  Component Value Date   TESTOSTERONE 342 02/23/2020    Current Medications:  Current  Outpatient Medications on File Prior to Visit  Medication Sig Dispense Refill   ALPRAZolam (XANAX) 0.5 MG tablet TAKE 1/2 TO 1 TABLET 1-2 TIMES DAILY ONLY AS NEEDED FOR PANIC OR ANXIETY ATTACK. LIMIT TO 5 DAYS/WEEK TO AVOID ADDICTION 30 tablet 2   citalopram (CELEXA) 40 MG tablet Take  1 tablet  Daily for Mood 90 tablet 3   losartan (COZAAR) 50 MG tablet TAKE 1 TABLET BY MOUTH DAILY FOR BLOOD PRESSURE 30 tablet 0   traZODone (DESYREL) 50 MG  tablet 1/2-1 tablet for sleep 30 tablet 2   No current facility-administered medications on file prior to visit.   Allergies:  No Known Allergies   Health Maintenance:  Immunization History  Administered Date(s) Administered   Influenza Inj Mdck Quad With Preservative 02/19/2018   Influenza,inj,quad, With Preservative 03/05/2016   Tdap 02/19/2018   Health Maintenance  Topic Date Due   COVID-19 Vaccine (1) Never done   HIV Screening  Never done   Hepatitis C Screening  Never done   Zoster Vaccines- Shingrix (1 of 2) Never done   COLONOSCOPY (Pts 45-53yrs Insurance coverage will need to be confirmed)  Never done   INFLUENZA VACCINE  01/08/2021   TETANUS/TDAP  02/20/2028   HPV VACCINES  Aged Out   Tetanus: 2019 Pneumovax: Prevnar 13: Flu vaccine: 2021 Zostavax:  DEXA: Colonoscopy: DUE 07/2019 wants to do cologuard EGD: Echo 2011  Patient Care Team: Lucky Cowboy, MD as PCP - General (Internal Medicine)  Medical History:  has Essential hypertension; Prediabetes; Hyperlipidemia; Medication management; Hypogonadism in male; Vitamin D deficiency; Erectile dysfunction; Depression, major, recurrent, moderate (HCC); Pulmonary nodules; and Insomnia on their problem list. Surgical History:  He  has a past surgical history that includes Foot fracture surgery (1985). Family History:  His family history includes Cancer in his maternal grandmother; Diabetes in his maternal grandfather and mother; Heart disease in his mother; Hypertension in his  mother. Social History:   reports that he quit smoking about 30 years ago. His smoking use included cigarettes. He has never used smokeless tobacco. He reports that he does not drink alcohol and does not use drugs.   Review of Systems:  Review of Systems  Constitutional: Negative.  Negative for chills and fever.  HENT: Negative.  Negative for congestion, hearing loss, sinus pain, sore throat and tinnitus.   Eyes: Negative.  Negative for blurred vision and double vision.  Respiratory: Negative.  Negative for cough, hemoptysis, sputum production, shortness of breath and wheezing.   Cardiovascular: Negative.  Negative for chest pain, palpitations and leg swelling.  Gastrointestinal: Negative.  Negative for abdominal pain, constipation, diarrhea, heartburn, nausea and vomiting.  Genitourinary: Negative.  Negative for dysuria and urgency.  Musculoskeletal:  Positive for joint pain (right elbow after dog hit into him). Negative for back pain, falls, myalgias and neck pain.  Skin: Negative.  Negative for rash.  Neurological: Negative.  Negative for dizziness, tingling, tremors, weakness and headaches.  Endo/Heme/Allergies: Negative.  Does not bruise/bleed easily.  Psychiatric/Behavioral:  Positive for depression. Negative for suicidal ideas. The patient has insomnia. The patient is not nervous/anxious.    Physical Exam: Estimated body mass index is 36.7 kg/m as calculated from the following:   Height as of this encounter: 6' (1.829 m).   Weight as of this encounter: 270 lb 9.6 oz (122.7 kg). BP 128/84   Pulse 75   Temp 97.9 F (36.6 C)   Ht 6' (1.829 m)   Wt 270 lb 9.6 oz (122.7 kg)   SpO2 98%   BMI 36.70 kg/m  General Appearance: Well nourished, in no apparent distress.  Eyes: PERRLA, EOMs, conjunctiva no swelling or erythema, normal fundi and vessels.  Sinuses: No Frontal/maxillary tenderness  ENT/Mouth: Ext aud canals clear, normal light reflex with TMs without erythema, bulging. Good  dentition. No erythema, swelling, or exudate on post pharynx. Tonsils not swollen or erythematous. Hearing normal.  Neck: Supple, thyroid normal. No bruits  Respiratory: Respiratory effort normal, BS equal bilaterally without rales, rhonchi, wheezing or  stridor.  Cardio: RRR without murmurs, rubs or gallops. Brisk peripheral pulses without edema.  Chest: symmetric, with normal excursions and percussion.  Abdomen: Soft, nontender, no guarding, rebound, hernias, masses, or organomegaly.  Lymphatics: Non tender without lymphadenopathy.  Genitourinary: defer Musculoskeletal: Full ROM all peripheral extremities,5/5 strength, and normal gait. Unable to reproduce pain in right elbow Skin:. Warm, dry without rashes, lesions, ecchymosis. Neuro: Cranial nerves intact, reflexes equal bilaterally. Normal muscle tone, no cerebellar symptoms. Sensation intact.  Psych: Awake and oriented X 3, muted/withdrawn affect but will laugh/smile make eye contact at times, Insight and Judgment appropriate.  EKG: Normal sinus rhythm AORTA SCAN:  < 3 cm  Bernard Thompson 2:28 PM [facility]

## 2021-02-26 ENCOUNTER — Encounter: Payer: 59 | Admitting: Adult Health Nurse Practitioner

## 2021-02-26 ENCOUNTER — Other Ambulatory Visit: Payer: Self-pay

## 2021-02-26 ENCOUNTER — Ambulatory Visit (INDEPENDENT_AMBULATORY_CARE_PROVIDER_SITE_OTHER): Payer: 59 | Admitting: Nurse Practitioner

## 2021-02-26 ENCOUNTER — Encounter: Payer: Self-pay | Admitting: Nurse Practitioner

## 2021-02-26 VITALS — BP 128/84 | HR 75 | Temp 97.9°F | Ht 72.0 in | Wt 270.6 lb

## 2021-02-26 DIAGNOSIS — Z0000 Encounter for general adult medical examination without abnormal findings: Secondary | ICD-10-CM

## 2021-02-26 DIAGNOSIS — I10 Essential (primary) hypertension: Secondary | ICD-10-CM | POA: Diagnosis not present

## 2021-02-26 DIAGNOSIS — Z136 Encounter for screening for cardiovascular disorders: Secondary | ICD-10-CM

## 2021-02-26 DIAGNOSIS — Z1211 Encounter for screening for malignant neoplasm of colon: Secondary | ICD-10-CM

## 2021-02-26 DIAGNOSIS — G8929 Other chronic pain: Secondary | ICD-10-CM

## 2021-02-26 DIAGNOSIS — G4700 Insomnia, unspecified: Secondary | ICD-10-CM

## 2021-02-26 DIAGNOSIS — Z1329 Encounter for screening for other suspected endocrine disorder: Secondary | ICD-10-CM

## 2021-02-26 DIAGNOSIS — Z79899 Other long term (current) drug therapy: Secondary | ICD-10-CM

## 2021-02-26 DIAGNOSIS — E785 Hyperlipidemia, unspecified: Secondary | ICD-10-CM

## 2021-02-26 DIAGNOSIS — Z125 Encounter for screening for malignant neoplasm of prostate: Secondary | ICD-10-CM

## 2021-02-26 DIAGNOSIS — N529 Male erectile dysfunction, unspecified: Secondary | ICD-10-CM

## 2021-02-26 DIAGNOSIS — M25521 Pain in right elbow: Secondary | ICD-10-CM

## 2021-02-26 DIAGNOSIS — Z1389 Encounter for screening for other disorder: Secondary | ICD-10-CM

## 2021-02-26 DIAGNOSIS — R7309 Other abnormal glucose: Secondary | ICD-10-CM

## 2021-02-26 DIAGNOSIS — E291 Testicular hypofunction: Secondary | ICD-10-CM

## 2021-02-26 DIAGNOSIS — F3341 Major depressive disorder, recurrent, in partial remission: Secondary | ICD-10-CM

## 2021-02-26 DIAGNOSIS — R9389 Abnormal findings on diagnostic imaging of other specified body structures: Secondary | ICD-10-CM

## 2021-02-26 DIAGNOSIS — Z0001 Encounter for general adult medical examination with abnormal findings: Secondary | ICD-10-CM

## 2021-02-26 DIAGNOSIS — E559 Vitamin D deficiency, unspecified: Secondary | ICD-10-CM

## 2021-02-26 MED ORDER — MELOXICAM 7.5 MG PO TABS: 7.5000 mg | ORAL_TABLET | Freq: Every day | ORAL | 2 refills | Status: AC

## 2021-02-26 MED ORDER — VENLAFAXINE HCL ER 75 MG PO CP24: 75.0000 mg | ORAL_CAPSULE | Freq: Every day | ORAL | 2 refills | Status: DC

## 2021-02-26 NOTE — Patient Instructions (Signed)
Joint Pain Joint pain may be caused by many things. Joint pain is likely to go away when you follow instructions from your health care provider for relieving pain at home. However, joint pain can also be caused by conditions that require more treatment. Common causes of joint pain include: Bruising in the area of the joint. Injury caused by repeating certain movements too many times. Age-related joint wear and tear. Buildup of uric acid crystals in the joint (gout). Inflammation of the joint. Other forms of arthritis. Infections of the joint or of the bone. Your health care provider may recommend that you take pain medicine or wear a supportive device like an elastic bandage, sling, or splint. If your joint pain continues, you may need lab or imaging tests to diagnose the cause of your joint pain. Follow these instructions at home: Managing pain, stiffness, and swelling   If directed, put ice on the painful area. To do this: If you have a removable elastic bandage, sling, or splint, take it off as told by your doctor. Put ice in a plastic bag. Place a towel between your skin and the bag. Leave the ice on for 20 minutes, 2-3 times a day. Remove the ice if your skin turns bright red. This is very important. If you cannot feel pain, heat, or cold, you have a greater risk of damage to the area. while you are sitting or lying down. If directed, apply heat to the painful area as often as told by your health care provider. Use the heat source that your health care provider recommends, such as a moist heat pack or a heating pad. Place a towel between your skin and the heat source. Leave the heat on for 20-30 minutes. Remove the heat if your skin turns bright red. This is especially important if you are unable to feel pain, heat, or cold. You have a greater risk of getting burned.  Activity Rest as told by your health care provider. Do not do anything that causes or worsens pain. Begin exercising  or stretching the affected area as told by your health care provider. Return to your normal activities as told by your health care provider. Ask your health care provider what activities are safe for you. If you have an elastic bandage, sling, or splint: Wear the bandage, sling, or splint as told by your health care provider. Remove it only as told by your health care provider. Loosen it if your fingers or toes below the joint tingle, become numb, or turn cold and blue. Keep it clean. Ask your health care provider if you should remove it before bathing. If the bandage, sling, or splint is not waterproof: Do not let it get wet. Cover it with a watertight covering when you take a bath or shower. General instructions Treatment may include medicines for pain and inflammation that are taken by mouth or applied to the skin. Take over-the-counter and prescription medicines only as told by your health care provider. Do not use any products that contain nicotine or tobacco, such as cigarettes, e-cigarettes, and chewing tobacco. If you need help quitting, ask your health care provider. Keep all follow-up visits. This is important. Contact a health care provider if: You have pain that gets worse and does not get better with medicine. Your joint pain does not improve within 3 days. You have increased bruising or swelling. You have a fever. You lose 10 lb (4.5 kg) or more without trying. Get help right away if: You   cannot move the joint. Your fingers or toes tingle, become numb, or turn cold and blue. You have a fever along with a joint that is red, warm, and swollen. Summary Joint pain may be caused by many things. Your health care provider may recommend that you take pain medicine or wear a supportive device such as an elastic bandage, sling, or splint. If your joint pain continues, you may need tests to diagnose the cause of your joint pain. Take over-the-counter and prescription medicines only as  told by your health care provider. This information is not intended to replace advice given to you by your health care provider. Make sure you discuss any questions you have with your health care provider. Document Revised: 09/08/2019 Document Reviewed: 09/08/2019 Elsevier Patient Education  2022 Else[employer] Inc.  

## 2021-02-27 LAB — CBC WITH DIFFERENTIAL/PLATELET
Absolute Monocytes: 501 cells/uL (ref 200–950)
Basophils Absolute: 39 cells/uL (ref 0–200)
Basophils Relative: 0.5 %
Eosinophils Absolute: 308 cells/uL (ref 15–500)
Eosinophils Relative: 4 %
HCT: 46.8 % (ref 38.5–50.0)
Hemoglobin: 16 g/dL (ref 13.2–17.1)
Lymphs Abs: 1987 cells/uL (ref 850–3900)
MCH: 28.4 pg (ref 27.0–33.0)
MCHC: 34.2 g/dL (ref 32.0–36.0)
MCV: 83.1 fL (ref 80.0–100.0)
MPV: 9.4 fL (ref 7.5–12.5)
Monocytes Relative: 6.5 %
Neutro Abs: 4866 cells/uL (ref 1500–7800)
Neutrophils Relative %: 63.2 %
Platelets: 290 10*3/uL (ref 140–400)
RBC: 5.63 10*6/uL (ref 4.20–5.80)
RDW: 12.2 % (ref 11.0–15.0)
Total Lymphocyte: 25.8 %
WBC: 7.7 10*3/uL (ref 3.8–10.8)

## 2021-02-27 LAB — COMPLETE METABOLIC PANEL WITH GFR
AG Ratio: 2 (calc) (ref 1.0–2.5)
ALT: 31 U/L (ref 9–46)
AST: 27 U/L (ref 10–35)
Albumin: 4.5 g/dL (ref 3.6–5.1)
Alkaline phosphatase (APISO): 80 U/L (ref 35–144)
BUN: 18 mg/dL (ref 7–25)
CO2: 25 mmol/L (ref 20–32)
Calcium: 9.5 mg/dL (ref 8.6–10.3)
Chloride: 103 mmol/L (ref 98–110)
Creat: 1.19 mg/dL (ref 0.70–1.30)
Globulin: 2.2 g/dL (calc) (ref 1.9–3.7)
Glucose, Bld: 76 mg/dL (ref 65–99)
Potassium: 4.2 mmol/L (ref 3.5–5.3)
Sodium: 137 mmol/L (ref 135–146)
Total Bilirubin: 0.7 mg/dL (ref 0.2–1.2)
Total Protein: 6.7 g/dL (ref 6.1–8.1)
eGFR: 74 mL/min/{1.73_m2} (ref >=60)

## 2021-02-27 LAB — MICROALBUMIN / CREATININE URINE RATIO
Creatinine, Urine: 204 mg/dL (ref 20–320)
Microalb Creat Ratio: 1 mcg/mg creat (ref <30)
Microalb, Ur: 0.2 mg/dL

## 2021-02-27 LAB — URINALYSIS, ROUTINE W REFLEX MICROSCOPIC
Bilirubin Urine: NEGATIVE
Glucose, UA: NEGATIVE
Hgb urine dipstick: NEGATIVE
Ketones, ur: NEGATIVE
Leukocytes,Ua: NEGATIVE
Nitrite: NEGATIVE
Protein, ur: NEGATIVE
Specific Gravity, Urine: 1.022 (ref 1.001–1.035)
pH: 5.5 (ref 5.0–8.0)

## 2021-02-27 LAB — HEMOGLOBIN A1C
Hgb A1c MFr Bld: 5.5 % of total Hgb (ref <5.7)
Mean Plasma Glucose: 111 mg/dL
eAG (mmol/L): 6.2 mmol/L

## 2021-02-27 LAB — PSA: PSA: 1 ng/mL (ref <=4.00)

## 2021-02-27 LAB — TESTOSTERONE: Testosterone: 349 ng/dL (ref 250–827)

## 2021-02-27 LAB — VITAMIN D 25 HYDROXY (VIT D DEFICIENCY, FRACTURES): Vit D, 25-Hydroxy: 47 ng/mL (ref 30–100)

## 2021-02-27 LAB — MAGNESIUM: Magnesium: 2.2 mg/dL (ref 1.5–2.5)

## 2021-03-19 ENCOUNTER — Ambulatory Visit
Admission: RE | Admit: 2021-03-19 | Discharge: 2021-03-19 | Disposition: A | Discharge status: Home / Self Care | Payer: 59 | Source: Ambulatory Visit | Attending: Nurse Practitioner | Admitting: Nurse Practitioner

## 2021-03-19 ENCOUNTER — Other Ambulatory Visit: Payer: Self-pay

## 2021-03-19 DIAGNOSIS — G8929 Other chronic pain: Secondary | ICD-10-CM

## 2021-03-23 ENCOUNTER — Other Ambulatory Visit: Payer: Self-pay | Admitting: Nurse Practitioner

## 2021-03-23 DIAGNOSIS — I10 Essential (primary) hypertension: Secondary | ICD-10-CM

## 2021-04-01 LAB — COLOGUARD: Cologuard: NEGATIVE

## 2021-04-26 NOTE — Progress Notes (Addendum)
Assessment and Plan:  Gatlin was seen today for follow-up.  Diagnoses and all orders for this visit:  Depression, major, recurrent, in partial remission (HCC) -     venlafaxine XR (EFFEXOR XR) 150 MG 24 hr capsule; Take 1 capsule (150 mg total) by mouth daily with breakfast. Will d/c Effexor 75mg  and start Effexor 150mg   Severe obesity (BMI 35.0-39.9) with comorbidity (HCC)  Overweight Long discussion about weight loss, diet, and exercise Recommended diet heavy in fruits and veggies and low in animal meats, cheeses, and dairy products, appropriate calorie intake Patient will work on increasing exercise, decreasing processed carbohydrates Follow up at next visit   Essential hypertension  - continue medications, DASH diet, exercise and monitor at home. Call if greater than 130/80.   Go to the ER if any chest pain, shortness of breath, nausea, dizziness, severe HA, changes vision/speech   Flu vaccine need Flu Vaccine Quad 6+MOS PF IM  Further disposition pending results of labs. Discussed med's effects and SE's.   Over 20 minutes of exam, counseling, chart review, and critical decision making was performed.   Future Appointments  Date Time Provider Department Center  02/26/2022  2:00 PM 05-10-1991, NP [facility] None    ------------------------------------------------------------------------------------------------------------------   HPI BP 130/90   Pulse 80   Temp 97.7 F (36.5 C)   Wt 276 lb 12.8 oz (125.6 kg)   SpO2 98%   BMI 37.54 kg/m  50 y.o.male presents for blood pressure , depression  He is currently on Cozaar 50mg  QD. Blood pressure has been well controlled.  Denies headaches, visual changes, shortness of breath. BP Readings from Last 3 Encounters:  04/30/21 130/90  02/26/21 128/84  01/22/21 (!) 132/94    Depression is improved with use of Effexor. He is still not having enjoyment in activities.  Not exercising . Has been eating a lot more. Denies thoughts  of hurting himself or others  BMI is Body mass index is 37.54 kg/m., he has not been working on diet and exercise. Wt Readings from Last 3 Encounters:  04/30/21 276 lb 12.8 oz (125.6 kg)  02/26/21 270 lb 9.6 oz (122.7 kg)  01/22/21 266 lb 3.2 oz (120.7 kg)    Past Medical History:  Diagnosis Date   Anxiety    Depression    HLD (hyperlipidemia)    HTN (hypertension)      No Known Allergies  Current Outpatient Medications on File Prior to Visit  Medication Sig   ALPRAZolam (XANAX) 0.5 MG tablet TAKE 1/2 TO 1 TABLET 1-2 TIMES DAILY ONLY AS NEEDED FOR PANIC OR ANXIETY ATTACK. LIMIT TO 5 DAYS/WEEK TO AVOID ADDICTION   losartan (COZAAR) 50 MG tablet Take  1 tablet  Daily  for BP                            /             TAKE 1 TABLET BY MOUTH DAILY FOR BLOOD PRESSURE   traZODone (DESYREL) 50 MG tablet 1/2-1 tablet for sleep   venlafaxine XR (EFFEXOR XR) 75 MG 24 hr capsule Take 1 capsule (75 mg total) by mouth daily.   meloxicam (MOBIC) 7.5 MG tablet Take 1 tablet (7.5 mg total) by mouth daily. (Patient not taking: Reported on 04/30/2021)   No current facility-administered medications on file prior to visit.    ROS: all negative except above.   Physical Exam:  BP 130/90  Pulse 80   Temp 97.7 F (36.5 C)   Wt 276 lb 12.8 oz (125.6 kg)   SpO2 98%   BMI 37.54 kg/m   General Appearance: Well nourished, in no apparent distress. Eyes: PERRLA, EOMs, conjunctiva no swelling or erythema Sinuses: No Frontal/maxillary tenderness ENT/Mouth: Ext aud canals clear, TMs without erythema, bulging. No erythema, swelling, or exudate on post pharynx.  Tonsils not swollen or erythematous. Hearing normal.  Neck: Supple, thyroid normal.  Respiratory: Respiratory effort normal, BS equal bilaterally without rales, rhonchi, wheezing or stridor.  Cardio: RRR with no MRGs. Brisk peripheral pulses without edema.  Abdomen: Soft, + BS.  Non tender, no guarding, rebound, hernias, masses. Lymphatics:  Non tender without lymphadenopathy.  Musculoskeletal: Full ROM, 5/5 strength, normal gait.  Skin: Warm, dry without rashes, lesions, ecchymosis.  Neuro: Cranial nerves intact. Normal muscle tone, no cerebellar symptoms. Sensation intact.  Psych: Awake and oriented X 3, normal affect, Insight and Judgment appropriate.     Revonda Humphrey, NP 11:46 AM [facility]

## 2021-04-30 ENCOUNTER — Encounter: Payer: Self-pay | Admitting: Nurse Practitioner

## 2021-04-30 ENCOUNTER — Other Ambulatory Visit: Payer: Self-pay

## 2021-04-30 ENCOUNTER — Ambulatory Visit (INDEPENDENT_AMBULATORY_CARE_PROVIDER_SITE_OTHER): Payer: 59 | Admitting: Nurse Practitioner

## 2021-04-30 VITALS — BP 130/90 | HR 80 | Temp 97.7°F | Wt 276.8 lb

## 2021-04-30 DIAGNOSIS — I10 Essential (primary) hypertension: Secondary | ICD-10-CM | POA: Diagnosis not present

## 2021-04-30 DIAGNOSIS — F3341 Major depressive disorder, recurrent, in partial remission: Secondary | ICD-10-CM

## 2021-04-30 DIAGNOSIS — Z23 Encounter for immunization: Secondary | ICD-10-CM | POA: Diagnosis not present

## 2021-04-30 MED ORDER — VENLAFAXINE HCL ER 150 MG PO CP24: 150.0000 mg | ORAL_CAPSULE | Freq: Every day | ORAL | 3 refills | Status: DC

## 2021-04-30 NOTE — Addendum Note (Signed)
Addended by: Revonda Humphrey on: 04/30/2021 02:49 PM   Modules accepted: Orders

## 2021-04-30 NOTE — Patient Instructions (Signed)
Major Depressive Disorder, Adult °Major depressive disorder (MDD) is a mental health condition. It may also be called clinical depression or unipolar depression. MDD causes symptoms of sadness, hopelessness, and loss of interest in things. These symptoms last most of the day, almost every day, for 2 weeks. MDD can also cause physical symptoms. It can interfere with relationships and with everyday activities, such as work, school, and activities that are usually pleasant. °MDD may be mild, moderate, or severe. It may be single-episode MDD, which happens once, or recurrent MDD, which may occur multiple times. °What are the causes? °The exact cause of this condition is not known. MDD is most likely caused by a combination of things, which may include: °Your personality traits. °Learned or conditioned behaviors or thoughts or feelings that reinforce negativity. °Any alcohol or substance misuse. °Long-term (chronic) physical or mental health illness. °Going through a traumatic experience or major life changes. °What increases the risk? °The following factors may make someone more likely to develop MDD: °A family history of depression. °Being a woman. °Troubled family relationships. °Abnormally low levels of certain brain chemicals. °Traumatic or painful events in childhood, especially abuse or loss of a parent. °A lot of stress from life experiences, such as poor living conditions or discrimination. °Chronic physical illness or other mental health disorders. °What are the signs or symptoms? °The main symptoms of MDD usually include: °Constant depressed or irritable mood. °A loss of interest in things and activities. °Other symptoms include: °Sleeping or eating too much or too little. °Unexplained weight gain or weight loss. °Tiredness or low energy. °Being agitated, restless, or weak. °Feeling hopeless, worthless, or guilty. °Trouble thinking clearly or making decisions. °Thoughts of suicide or thoughts of harming  others. °Isolating oneself or avoiding other people or activities. °Trouble completing tasks, work, or any normal obligations. °Severe symptoms of this condition may include: °Psychotic depression.This may include false beliefs, or delusions. It may also include seeing, hearing, tasting, smelling, or feeling things that are not real (hallucinations). °Chronic depression or persistent depressive disorder. This is low-level depression that lasts for at least 2 years. °Melancholic depression, or feeling extremely sad and hopeless. °Catatonic depression, which includes trouble speaking and trouble moving. °How is this diagnosed? °This condition may be diagnosed based on: °Your symptoms. °Your medical and mental health history. You may be asked questions about your lifestyle, including any drug and alcohol use. °A physical exam. °Blood tests to rule out other conditions. °MDD is confirmed if you have the following symptoms most of the day, nearly every day, in a 2-week period: °Either a depressed mood or loss of interest. °At least four other MDD symptoms. °How is this treated? °This condition is usually treated by mental health professionals, such as psychologists, psychiatrists, and clinical social workers. You may need more than one type of treatment. Treatment may include: °Psychotherapy, also called talk therapy or counseling. Types of psychotherapy include: °Cognitive behavioral therapy (CBT). This teaches you to recognize unhealthy feelings, thoughts, and behaviors, and replace them with positive thoughts and actions. °Interpersonal therapy (IPT). This helps you to improve the way you communicate with others or relate to them. °Family therapy. This treatment includes members of your family. °Medicines to treat anxiety and depression. These medicines help to balance the brain chemicals that affect your emotions. °Lifestyle changes. You may be asked to: °Limit alcohol use and avoid drug use. °Get regular  exercise. °Get plenty of sleep. °Make healthy eating choices. °Spend more time outdoors. °Brain stimulation. This may   be done if symptoms are very severe and other treatments have not worked. Examples of this treatment are electroconvulsive therapy and transcranial magnetic stimulation. °Follow these instructions at home: °Activity °Exercise regularly and spend time outdoors. °Find activities that you enjoy doing, and make time to do them. °Find healthy ways to manage stress, such as: °Meditation or deep breathing. °Spending time in nature. °Journaling. °Return to your normal activities as told by your health care provider. Ask your health care provider what activities are safe for you. °Alcohol and drug use °If you drink alcohol: °Limit how much you use to: °0-1 drink a day for women who are not pregnant. °0-2 drinks a day for men. °Be aware of how much alcohol is in your drink. In the [place] one drink equals one 12 oz bottle of beer (355 mL), one 5 oz glass of wine (148 mL), or one 1½ oz glass of hard liquor (44 mL). °Discuss your alcohol use with your health care provider. Alcohol can affect any antidepressant medicines you are taking. °Discuss any drug use with your health care provider. °General instructions ° °Take over-the-counter and prescription medicines only as told by your health care provider. °Eat a healthy diet and get plenty of sleep. °Consider joining a support group. Your health care provider may be able to recommend one. °Keep all follow-up visits as told by your health care provider. This is important. °Where to find more information °National Alliance on Mental Illness: www.nami.org °[place] National Institute of Mental Health: www.nimh.nih.gov °Contact a health care provider if: °Your symptoms get worse. °You develop new symptoms. °Get help right away if: °You self-harm. °You have serious thoughts about hurting yourself or others. °You hallucinate. °If you ever feel like you may hurt yourself or  others, or have thoughts about taking your own life, get help right away. Go to your nearest emergency department or: °Call your local emergency services (911 in the [place] °Call a suicide crisis helpline, such as the National Suicide Prevention Lifeline at 1-800-273-8255 or 988 in the [place] This is open 24 hours a day in the [place] °Text the Crisis Text Line at 741741 (in the [place] °Summary °Major depressive disorder (MDD) is a mental health condition. MDD causes symptoms of sadness, hopelessness, and loss of interest in things. These symptoms last most of the day, almost every day, for 2 weeks. °The symptoms of MDD can interfere with relationships and with everyday activities. °Treatments and support are available for people who develop MDD. You may need more than one type of treatment. °Get help right away if you have serious thoughts about hurting yourself or others. °This information is not intended to replace advice given to you by your health care provider. Make sure you discuss any questions you have with your health care provider. °Document Revised: 12/20/2020 Document Reviewed: 05/08/2019 °Elsevier Patient Education © 2022 Elsevier Inc. ° °

## 2021-05-02 ENCOUNTER — Other Ambulatory Visit: Payer: Self-pay | Admitting: Adult Health Nurse Practitioner

## 2021-05-02 DIAGNOSIS — F419 Anxiety disorder, unspecified: Secondary | ICD-10-CM

## 2021-07-30 NOTE — Progress Notes (Signed)
Assessment and Plan:  Bernard Thompson was seen today for acute visit.  Diagnoses and all orders for this visit:  Essential hypertension -     CBC with Differential/Platelet -  continue medications, DASH diet, exercise and monitor at home. Call if greater than 130/80.  Follow up in 2 weeks with blood pressure log to evaluate treatment.   Hyperlipidemia, unspecified hyperlipidemia type Continue diet and exercise. If cholesterol is not improved will start Rosuvastatin 5 mg  -     COMPLETE METABOLIC PANEL WITH GFR -     Lipid panel   Depression, major, recurrent, in partial remission (HCC)       - Decrease Effexor to 75 mg daily and behavior modifications, monitor symptoms       - Add Vrylar 1.5mg  PO QD       - Instructed patient to contact office or on-call physician promptly should condition worsen or any new symptoms appear. IF THE PATIENT HAS ANY SUICIDAL OR HOMICIDAL IDEATIONS, CALL THE OFFICE, DISCUSS WITH A SUPPORT MEMBER, OR GO TO THE ER IMMEDIATELY. Patient was agreeable with this plan.   Medication management       - Continued  Anxiety       - Pt using xanax very sparingly, using relaxation techniques and exercise to help control symptoms.  Severe obesity (BMI 35.0-39.9) with comorbidity (HCC)        - Continue diet and exercise  Upset because he has gained weight back   Further disposition pending results of labs. Discussed med's effects and SE's.   Over 40 minutes of exam, counseling, chart review, and critical decision making was performed.   Future Appointments  Date Time Provider Department Center  02/26/2022  2:00 PM Revonda Humphrey, NP [facility] None    ------------------------------------------------------------------------------------------------------------------   HPI BP (!) 138/92    Pulse 77    Temp 97.7 F (36.5 C)    Wt 280 lb 12.8 oz (127.4 kg)    SpO2 98%    BMI 38.08 kg/m  51 y.o.male presents for evaluation of depression  Hypertension: He is currently on  Losartan 50 mg QD. Denies headaches, chest pain and shortness of breath. BP Readings from Last 3 Encounters:  07/31/21 (!) 138/92  04/30/21 130/90  02/26/21 128/84    Hyperlipidemia: He is not on cholesterol medication and his last cholesterol was not at goal. Had been on pravastatin and rosuvastatin in the past but is not currently on any medication.  Has been trying to bring it down with diet.  Lab Results  Component Value Date   CHOL 211 (H) 01/08/2021   HDL 52 01/08/2021   LDLCALC 139 (H) 01/08/2021   TRIG 102 01/08/2021   CHOLHDL 4.1 01/08/2021      Obesity: BMI is Body mass index is 38.08 kg/m., he has been working on diet .  Pt has been eating more vegetables, very little red meat and increased water consumption. He has been increasing sweets in diet.  Wt Readings from Last 3 Encounters:  07/31/21 280 lb 12.8 oz (127.4 kg)  04/30/21 276 lb 12.8 oz (125.6 kg)  02/26/21 270 lb 9.6 oz (122.7 kg)   Depression/Anxiety: He is currently taking Effexor XR 150 mg daily and Xanax 0.5mg  1/2 tab PRN for sleep, panic attacks.  Finds harder to get motivated. Continues to have self esteem issues. Feels numb- currently no sex drive. Doesn't even enjoy his coffee currently.  Denies suicidal/homicidal thoughts  Past Medical History:  Diagnosis Date  Anxiety    Depression    HLD (hyperlipidemia)    HTN (hypertension)      No Known Allergies  Current Outpatient Medications on File Prior to Visit  Medication Sig   ALPRAZolam (XANAX) 0.5 MG tablet TAKE 1/2 TO 1 TABLET BY MOUTH 1 TO 2 TIMES DAILY ONLY AS NEEDED FOR PANIC OR ANXIETY ATTACK, LIMIT TO 5 DAYS PER WEEK TO AVOID ADDICTION   losartan (COZAAR) 50 MG tablet Take  1 tablet  Daily  for BP                            /             TAKE 1 TABLET BY MOUTH DAILY FOR BLOOD PRESSURE   traZODone (DESYREL) 50 MG tablet 1/2-1 tablet for sleep   venlafaxine XR (EFFEXOR XR) 150 MG 24 hr capsule Take 1 capsule (150 mg total) by mouth daily with  breakfast.   No current facility-administered medications on file prior to visit.    Review of Systems  Constitutional:  Negative for chills, fever and weight loss.  HENT:  Negative for congestion and hearing loss.   Eyes:  Negative for blurred vision and double vision.  Respiratory:  Negative for cough, shortness of breath and wheezing.   Cardiovascular:  Negative for chest pain, palpitations, orthopnea and leg swelling.  Gastrointestinal:  Negative for abdominal pain, constipation, diarrhea, heartburn, nausea and vomiting.  Musculoskeletal:  Negative for falls, joint pain and myalgias.  Skin:  Negative for rash.  Neurological:  Negative for dizziness, tingling, tremors, loss of consciousness and headaches.  Psychiatric/Behavioral:  Positive for depression. Negative for memory loss and suicidal ideas. The patient is nervous/anxious and has insomnia.     Physical Exam:  BP (!) 138/92    Pulse 77    Temp 97.7 F (36.5 C)    Wt 280 lb 12.8 oz (127.4 kg)    SpO2 98%    BMI 38.08 kg/m   General Appearance: Well nourished, in no apparent distress. Eyes: PERRLA, EOMs, conjunctiva no swelling or erythema Sinuses: No Frontal/maxillary tenderness ENT/Mouth: Ext aud canals clear, TMs without erythema, bulging. No erythema, swelling, or exudate on post pharynx.  Tonsils not swollen or erythematous. Hearing normal.  Neck: Supple, thyroid normal.  Respiratory: Respiratory effort normal, BS equal bilaterally without rales, rhonchi, wheezing or stridor.  Cardio: RRR with no MRGs. Brisk peripheral pulses without edema.  Abdomen: Soft, + BS.  Non tender, no guarding, rebound, hernias, masses. Lymphatics: Non tender without lymphadenopathy.  Musculoskeletal: Full ROM, 5/5 strength, normal gait.  Skin: Warm, dry without rashes, lesions, ecchymosis.  Neuro: Cranial nerves intact. Normal muscle tone, no cerebellar symptoms. Sensation intact.  Psych: Awake and oriented X 3, flat affect, Insight and  Judgment appropriate.     Revonda Humphrey, NP 11:17 AM [facility]

## 2021-07-31 ENCOUNTER — Ambulatory Visit (INDEPENDENT_AMBULATORY_CARE_PROVIDER_SITE_OTHER): Payer: 59 | Admitting: Nurse Practitioner

## 2021-07-31 ENCOUNTER — Encounter: Payer: Self-pay | Admitting: Nurse Practitioner

## 2021-07-31 ENCOUNTER — Other Ambulatory Visit: Payer: Self-pay

## 2021-07-31 DIAGNOSIS — E785 Hyperlipidemia, unspecified: Secondary | ICD-10-CM

## 2021-07-31 DIAGNOSIS — E559 Vitamin D deficiency, unspecified: Secondary | ICD-10-CM

## 2021-07-31 DIAGNOSIS — Z79899 Other long term (current) drug therapy: Secondary | ICD-10-CM

## 2021-07-31 DIAGNOSIS — F419 Anxiety disorder, unspecified: Secondary | ICD-10-CM

## 2021-07-31 DIAGNOSIS — I10 Essential (primary) hypertension: Secondary | ICD-10-CM | POA: Diagnosis not present

## 2021-07-31 DIAGNOSIS — F3341 Major depressive disorder, recurrent, in partial remission: Secondary | ICD-10-CM | POA: Diagnosis not present

## 2021-07-31 LAB — COMPLETE METABOLIC PANEL WITH GFR
AG Ratio: 1.9 (calc) (ref 1.0–2.5)
ALT: 45 U/L (ref 9–46)
AST: 29 U/L (ref 10–35)
Albumin: 4.5 g/dL (ref 3.6–5.1)
Alkaline phosphatase (APISO): 75 U/L (ref 35–144)
BUN: 10 mg/dL (ref 7–25)
CO2: 27 mmol/L (ref 20–32)
Calcium: 9.8 mg/dL (ref 8.6–10.3)
Chloride: 104 mmol/L (ref 98–110)
Creat: 1.23 mg/dL (ref 0.70–1.30)
Globulin: 2.4 g/dL (calc) (ref 1.9–3.7)
Glucose, Bld: 87 mg/dL (ref 65–99)
Potassium: 4.6 mmol/L (ref 3.5–5.3)
Sodium: 138 mmol/L (ref 135–146)
Total Bilirubin: 0.8 mg/dL (ref 0.2–1.2)
Total Protein: 6.9 g/dL (ref 6.1–8.1)
eGFR: 71 mL/min/{1.73_m2} (ref >=60)

## 2021-07-31 LAB — CBC WITH DIFFERENTIAL/PLATELET
Absolute Monocytes: 422 cells/uL (ref 200–950)
Basophils Absolute: 73 cells/uL (ref 0–200)
Basophils Relative: 1.1 %
Eosinophils Absolute: 218 cells/uL (ref 15–500)
Eosinophils Relative: 3.3 %
HCT: 47.3 % (ref 38.5–50.0)
Hemoglobin: 16.6 g/dL (ref 13.2–17.1)
Lymphs Abs: 1901 cells/uL (ref 850–3900)
MCH: 29.4 pg (ref 27.0–33.0)
MCHC: 35.1 g/dL (ref 32.0–36.0)
MCV: 83.7 fL (ref 80.0–100.0)
MPV: 9.6 fL (ref 7.5–12.5)
Monocytes Relative: 6.4 %
Neutro Abs: 3986 cells/uL (ref 1500–7800)
Neutrophils Relative %: 60.4 %
Platelets: 308 10*3/uL (ref 140–400)
RBC: 5.65 10*6/uL (ref 4.20–5.80)
RDW: 12.5 % (ref 11.0–15.0)
Total Lymphocyte: 28.8 %
WBC: 6.6 10*3/uL (ref 3.8–10.8)

## 2021-07-31 LAB — LIPID PANEL
Cholesterol: 220 mg/dL — ABNORMAL HIGH (ref <200)
HDL: 49 mg/dL (ref >=40)
LDL Cholesterol (Calc): 140 mg/dL (calc) — ABNORMAL HIGH
Non-HDL Cholesterol (Calc): 171 mg/dL (calc) — ABNORMAL HIGH (ref <130)
Total CHOL/HDL Ratio: 4.5 (calc) (ref <5.0)
Triglycerides: 173 mg/dL — ABNORMAL HIGH (ref <150)

## 2021-07-31 LAB — VITAMIN D 25 HYDROXY (VIT D DEFICIENCY, FRACTURES): Vit D, 25-Hydroxy: 31 ng/mL (ref 30–100)

## 2021-07-31 MED ORDER — CARIPRAZINE HCL 1.5 MG PO CAPS: 1.5000 mg | ORAL_CAPSULE | Freq: Every day | ORAL | 3 refills | Status: DC

## 2021-07-31 MED ORDER — VENLAFAXINE HCL ER 75 MG PO CP24: 75.0000 mg | ORAL_CAPSULE | Freq: Every day | ORAL | 2 refills | Status: DC

## 2021-08-01 ENCOUNTER — Other Ambulatory Visit: Payer: Self-pay | Admitting: Nurse Practitioner

## 2021-08-01 DIAGNOSIS — E785 Hyperlipidemia, unspecified: Secondary | ICD-10-CM

## 2021-08-01 MED ORDER — ROSUVASTATIN CALCIUM 5 MG PO TABS: 5.0000 mg | ORAL_TABLET | Freq: Every day | ORAL | 11 refills | Status: DC

## 2021-08-06 ENCOUNTER — Other Ambulatory Visit: Payer: Self-pay | Admitting: Nurse Practitioner

## 2021-08-06 ENCOUNTER — Other Ambulatory Visit: Payer: Self-pay | Admitting: Adult Health

## 2021-08-06 DIAGNOSIS — G4700 Insomnia, unspecified: Secondary | ICD-10-CM

## 2021-08-06 DIAGNOSIS — F419 Anxiety disorder, unspecified: Secondary | ICD-10-CM

## 2021-09-15 ENCOUNTER — Encounter: Payer: Self-pay | Admitting: Nurse Practitioner

## 2021-09-17 ENCOUNTER — Other Ambulatory Visit: Payer: Self-pay | Admitting: Nurse Practitioner

## 2021-09-17 DIAGNOSIS — G4700 Insomnia, unspecified: Secondary | ICD-10-CM

## 2021-09-17 DIAGNOSIS — F419 Anxiety disorder, unspecified: Secondary | ICD-10-CM

## 2021-09-17 MED ORDER — TRAZODONE HCL 50 MG PO TABS: ORAL_TABLET | ORAL | 2 refills | Status: DC

## 2021-09-26 ENCOUNTER — Other Ambulatory Visit: Payer: Self-pay | Admitting: Nurse Practitioner

## 2021-09-26 DIAGNOSIS — F3341 Major depressive disorder, recurrent, in partial remission: Secondary | ICD-10-CM

## 2021-09-26 MED ORDER — VENLAFAXINE HCL ER 75 MG PO CP24: 75.0000 mg | ORAL_CAPSULE | Freq: Every day | ORAL | 3 refills | Status: DC

## 2021-10-26 NOTE — Progress Notes (Signed)
Assessment and Plan:    Essential hypertension -     CBC with Differential/Platelet -      continue medications, DASH diet, exercise and monitor at home. Call if greater than 130/80.  Follow up in 2 weeks with blood pressure log to evaluate treatment.     Hyperlipidemia, unspecified hyperlipidemia type Continue diet and exercise. Continue Rosuvastatin 5 mg  -     COMPLETE METABOLIC PANEL WITH GFR -     Lipid panel - TSH  Abnormal Glucose Continue diet and exercise -A1c   Vitamin D Deficiency Continue Vit D supplementation to maintain value in therapeutic level of 60-100    Depression, major, recurrent, in partial remission (HCC)       -     Continue Effexor and Vraylar       - Instructed patient to contact office or on-call physician promptly should condition worsen or any new symptoms appear. IF THE PATIENT HAS ANY SUICIDAL OR HOMICIDAL IDEATIONS, CALL THE OFFICE, DISCUSS WITH A SUPPORT MEMBER, OR GO TO THE ER IMMEDIATELY. Patient was agreeable with this plan.    Medication management       -     Continued   Anxiety       -     Pt using xanax very sparingly, using relaxation techniques and exercise to help control symptoms.   Severe obesity (BMI 35.0-39.9) with comorbidity (HCC) Long discussion about weight loss, diet, and exercise Recommended diet heavy in fruits and veggies and low in animal meats, cheeses, and dairy products, appropriate calorie intake Patient will work on limiting red meat, diary , eggs and simple carbs Follow up at next visit  -He is being required by DOT to do sleep study- will notify office of results              Further disposition pending results of labs. Discussed med's effects and SE's.   Over 40 minutes of exam, counseling, chart review, and critical decision making was performed.   Future Appointments  Date Time Provider Department Center  10/29/2021 11:30 AM Revonda Humphrey, NP [facility] None  03/04/2022 10:00 AM Revonda Humphrey, NP [facility]  None    ------------------------------------------------------------------------------------------------------------------   HPI BP 140/90   Pulse 67   Temp (!) 97.5 F (36.4 C)   Wt 275 lb 6.4 oz (124.9 kg)   SpO2 98%   BMI 37.35 kg/m  51 y.o.male presents for evaluation of depression, HTN, HLD, obesity  His DOT physical is requiring him to get a sleep study.  His weight and neck size and putting him in category requiring sleep study. Denies snoring, daytime somnolence.  Hypertension: He is currently on Losartan 50 mg QD. Denies headaches, chest pain and shortness of breath. BP Readings from Last 3 Encounters:  10/29/21 140/90  07/31/21 (!) 138/92  04/30/21 130/90    Hyperlipidemia: He is on Rosuvastatin and his last cholesterol was not at goal.  Has been trying to bring it down with diet.  Lab Results  Component Value Date   CHOL 220 (H) 07/31/2021   HDL 49 07/31/2021   LDLCALC 140 (H) 07/31/2021   TRIG 173 (H) 07/31/2021   CHOLHDL 4.5 07/31/2021      Obesity: BMI is Body mass index is 37.35 kg/m., he has been working on diet .  Pt has been eating more vegetables, very little red meat and increased water consumption. He has been decreasing sweets in diet.  Wt Readings from Last 3 Encounters:  10/29/21 275 lb 6.4 oz (124.9 kg)  07/31/21 280 lb 12.8 oz (127.4 kg)  04/30/21 276 lb 12.8 oz (125.6 kg)   Depression/Anxiety: He is currently taking Effexor XR 75mg  and Vraylar 1.5 mg daily and Xanax 0.5mg  1/2 tab PRN for sleep, panic attacks.  His mood has felt improved.  Sleeping well. He is laughing and smiling at visit today.   Past Medical History:  Diagnosis Date   Anxiety    Depression    HLD (hyperlipidemia)    HTN (hypertension)      No Known Allergies  Current Outpatient Medications on File Prior to Visit  Medication Sig   ALPRAZolam (XANAX) 0.5 MG tablet TAKE 1/2 TO 1 TABLET BY MOUTH 1 TO 2 TIMES DAILY ONLY AS NEEDED FOR PANIC OR ANXIETY ATTACK, LIMIT TO 5  DAYS PER WEEK TO AVOID ADDICTION   cariprazine (VRAYLAR) 1.5 MG capsule Take 1 capsule (1.5 mg total) by mouth daily.   losartan (COZAAR) 50 MG tablet Take  1 tablet  Daily  for BP                            /             TAKE 1 TABLET BY MOUTH DAILY FOR BLOOD PRESSURE   rosuvastatin (CRESTOR) 5 MG tablet Take 1 tablet (5 mg total) by mouth daily.   traZODone (DESYREL) 50 MG tablet TAKE 1/2 TO 1 TABLET BY MOUTH DAILY FOR SLEEP   venlafaxine XR (EFFEXOR XR) 75 MG 24 hr capsule Take 1 capsule (75 mg total) by mouth daily. (Patient not taking: Reported on 10/29/2021)   No current facility-administered medications on file prior to visit.    Review of Systems  Constitutional:  Negative for chills, fever and weight loss.  HENT:  Negative for congestion and hearing loss.   Eyes:  Negative for blurred vision and double vision.  Respiratory:  Negative for cough, shortness of breath and wheezing.   Cardiovascular:  Negative for chest pain, palpitations, orthopnea and leg swelling.  Gastrointestinal:  Negative for abdominal pain, constipation, diarrhea, heartburn, nausea and vomiting.  Musculoskeletal:  Negative for falls, joint pain and myalgias.  Skin:  Negative for rash.  Neurological:  Negative for dizziness, tingling, tremors, loss of consciousness and headaches.  Psychiatric/Behavioral:  Positive for depression. Negative for memory loss and suicidal ideas. The patient is nervous/anxious and has insomnia.     Physical Exam:  BP 140/90   Pulse 67   Temp (!) 97.5 F (36.4 C)   Wt 275 lb 6.4 oz (124.9 kg)   SpO2 98%   BMI 37.35 kg/m   General Appearance: Well nourished, in no apparent distress. Eyes: PERRLA, EOMs, conjunctiva no swelling or erythema Sinuses: No Frontal/maxillary tenderness ENT/Mouth: Ext aud canals clear, TMs without erythema, bulging. No erythema, swelling, or exudate on post pharynx.  Tonsils not swollen or erythematous. Hearing normal.  Neck: Supple, thyroid normal.   Respiratory: Respiratory effort normal, BS equal bilaterally without rales, rhonchi, wheezing or stridor.  Cardio: RRR with no MRGs. Brisk peripheral pulses without edema.  Abdomen: Soft, + BS.  Non tender, no guarding, rebound, hernias, masses. Lymphatics: Non tender without lymphadenopathy.  Musculoskeletal: Full ROM, 5/5 strength, normal gait.  Skin: Warm, dry without rashes, lesions, ecchymosis.  Neuro: Cranial nerves intact. Normal muscle tone, no cerebellar symptoms. Sensation intact.  Psych: Awake and oriented X 3, flat affect, Insight and Judgment appropriate.  Revonda Humphrey, NP 11:27 AM [facility]

## 2021-10-29 ENCOUNTER — Ambulatory Visit: Payer: 59 | Admitting: Nurse Practitioner

## 2021-10-29 ENCOUNTER — Encounter: Payer: Self-pay | Admitting: Nurse Practitioner

## 2021-10-29 DIAGNOSIS — F3341 Major depressive disorder, recurrent, in partial remission: Secondary | ICD-10-CM

## 2021-10-29 DIAGNOSIS — G4700 Insomnia, unspecified: Secondary | ICD-10-CM

## 2021-10-29 DIAGNOSIS — E785 Hyperlipidemia, unspecified: Secondary | ICD-10-CM

## 2021-10-29 DIAGNOSIS — I10 Essential (primary) hypertension: Secondary | ICD-10-CM | POA: Diagnosis not present

## 2021-10-29 DIAGNOSIS — Z79899 Other long term (current) drug therapy: Secondary | ICD-10-CM

## 2021-10-29 DIAGNOSIS — R7309 Other abnormal glucose: Secondary | ICD-10-CM

## 2021-10-29 DIAGNOSIS — F419 Anxiety disorder, unspecified: Secondary | ICD-10-CM

## 2021-10-29 DIAGNOSIS — E559 Vitamin D deficiency, unspecified: Secondary | ICD-10-CM

## 2021-10-29 MED ORDER — VENLAFAXINE HCL ER 75 MG PO CP24: 75.0000 mg | ORAL_CAPSULE | Freq: Every day | ORAL | 2 refills | Status: DC

## 2021-10-30 LAB — CBC WITH DIFFERENTIAL/PLATELET
Absolute Monocytes: 511 cells/uL (ref 200–950)
Basophils Absolute: 81 cells/uL (ref 0–200)
Basophils Relative: 1.1 %
Eosinophils Absolute: 215 cells/uL (ref 15–500)
Eosinophils Relative: 2.9 %
HCT: 47.8 % (ref 38.5–50.0)
Hemoglobin: 16.8 g/dL (ref 13.2–17.1)
Lymphs Abs: 1857 cells/uL (ref 850–3900)
MCH: 29.5 pg (ref 27.0–33.0)
MCHC: 35.1 g/dL (ref 32.0–36.0)
MCV: 84 fL (ref 80.0–100.0)
MPV: 9.8 fL (ref 7.5–12.5)
Monocytes Relative: 6.9 %
Neutro Abs: 4736 cells/uL (ref 1500–7800)
Neutrophils Relative %: 64 %
Platelets: 284 10*3/uL (ref 140–400)
RBC: 5.69 10*6/uL (ref 4.20–5.80)
RDW: 12.3 % (ref 11.0–15.0)
Total Lymphocyte: 25.1 %
WBC: 7.4 10*3/uL (ref 3.8–10.8)

## 2021-10-30 LAB — COMPLETE METABOLIC PANEL WITH GFR
AG Ratio: 1.8 (calc) (ref 1.0–2.5)
ALT: 36 U/L (ref 9–46)
AST: 28 U/L (ref 10–35)
Albumin: 4.6 g/dL (ref 3.6–5.1)
Alkaline phosphatase (APISO): 77 U/L (ref 35–144)
BUN: 19 mg/dL (ref 7–25)
CO2: 26 mmol/L (ref 20–32)
Calcium: 9.9 mg/dL (ref 8.6–10.3)
Chloride: 105 mmol/L (ref 98–110)
Creat: 1.25 mg/dL (ref 0.70–1.30)
Globulin: 2.6 g/dL (calc) (ref 1.9–3.7)
Glucose, Bld: 93 mg/dL (ref 65–99)
Potassium: 4.8 mmol/L (ref 3.5–5.3)
Sodium: 139 mmol/L (ref 135–146)
Total Bilirubin: 0.8 mg/dL (ref 0.2–1.2)
Total Protein: 7.2 g/dL (ref 6.1–8.1)
eGFR: 69 mL/min/{1.73_m2} (ref >=60)

## 2021-10-30 LAB — LIPID PANEL
Cholesterol: 160 mg/dL (ref <200)
HDL: 49 mg/dL (ref >=40)
LDL Cholesterol (Calc): 90 mg/dL (calc)
Non-HDL Cholesterol (Calc): 111 mg/dL (calc) (ref <130)
Total CHOL/HDL Ratio: 3.3 (calc) (ref <5.0)
Triglycerides: 116 mg/dL (ref <150)

## 2021-10-30 LAB — HEMOGLOBIN A1C
Hgb A1c MFr Bld: 5.4 % of total Hgb (ref <5.7)
Mean Plasma Glucose: 108 mg/dL
eAG (mmol/L): 6 mmol/L

## 2021-10-30 LAB — TSH: TSH: 1.94 mIU/L (ref 0.40–4.50)

## 2021-11-02 ENCOUNTER — Other Ambulatory Visit: Payer: Self-pay | Admitting: Nurse Practitioner

## 2021-11-02 DIAGNOSIS — F3341 Major depressive disorder, recurrent, in partial remission: Secondary | ICD-10-CM

## 2021-12-08 ENCOUNTER — Other Ambulatory Visit: Payer: Self-pay | Admitting: Adult Health

## 2021-12-08 DIAGNOSIS — F419 Anxiety disorder, unspecified: Secondary | ICD-10-CM

## 2022-02-26 ENCOUNTER — Encounter: Payer: 59 | Admitting: Nurse Practitioner

## 2022-03-03 NOTE — Progress Notes (Signed)
Complete Physical  Assessment and Plan: Bernard Thompson was seen today for annual exam.  Diagnoses and all orders for this visit:  Essential hypertension -     CBC with Differential/Platelet - - continue medications, DASH diet, exercise and monitor at home. Call if greater than 130/80.    Hyperlipidemia, unspecified hyperlipidemia type -     COMPLETE METABOLIC PANEL WITH GFR - Currently on no medication, discussed restarting Crestor if Lipids remain elevated and pt is amenable  Depression, major, recurrent, in partial remission ([place])  Will refer to counseling Discussed go to ER or call office if any thought of self harm or thoughts of harming others.  Pt to follow up in 2 months to reevaluate or sooner if needed    Severe obesity (BMI 35.0-39.9) with comorbidity ([place])  Discussed importance of diet and exercise  Insomnia, unspecified type  Continue Trazodone and practicing good sleep behaviors  Abnormal glucose -     Hemoglobin A1c - Stressed importance of diet and exercise  Abnormal CT of the chest  -     Chest CT 08/22/20, Stable small bilateral pulmonary nodules. The dominant pulmonary nodule is located in the left lower lobe and measures approximately 6 mm. A 12-18 month follow-up CT of the chest is recommended to confirm ongoing stability  Screening PSA (prostate specific antigen) -     PSA  Erectile dysfunction, unspecified erectile dysfunction type  Testosterone  Vitamin D deficiency -     VITAMIN D 25 Hydroxy (Vit-D Deficiency, Fractures)  Medication management -     CBC with Differential/Platelet -     COMPLETE METABOLIC PANEL WITH GFR -     Magnesium -     Hemoglobin A1c -     VITAMIN D 25 Hydroxy (Vit-D Deficiency, Fractures) -     PSA -     Testosterone -     Urinalysis, Routine w reflex microscopic -     Microalbumin / creatinine urine ratio -     EKG 12-Lead  Screening for ischemic heart disease -     EKG 12-Lead  Screening for hematuria or  proteinuria -     Urinalysis, Routine w reflex microscopic -     Microalbumin / creatinine urine ratio      Discussed med's effects and SE's. Screening labs and tests as requested with regular follow-up as recommended. Over 40 minutes of exam, counseling, chart review and critical decision making was performed  HPI Patient presents for a complete physical.   His blood pressure has been controlled at home, today their BP is BP: 118/88  BP Readings from Last 3 Encounters:  03/04/22 118/88  10/29/21 140/90  07/31/21 (!) 138/92  He does not workout. He denies chest pain, shortness of breath, dizziness.   He feels like his mood is controlled on the Effexor, did not start the [place].   No headaches, no changes in vision. Not hearing voices, no  Hallucinations. Negative RPR in 2019   During a ER visit he had CTA that showed abnormal Small triangular shaped area of ground-glass opacity in the superior aspect of the right lower lobe and minimal similar densityin the right lower lobe. These are most likely inflammatory in nature.. 6 pack a year smoking hx, quit 1990. He is due for repeat CT  BMI is Body mass index is 36.68 kg/m., he is working on diet and exercise.Has stopped exercise and diet due to depression He had lost down to 255 but has gained some back Wt  Readings from Last 3 Encounters:  03/04/22 274 lb 3.2 oz (124.4 kg)  10/29/21 275 lb 6.4 oz (124.9 kg)  07/31/21 280 lb 12.8 oz (127.4 kg)   He is not on cholesterol medication  His cholesterol is not at goal.  Mom with CAD at 35.  6 pack a year smoking hx, quit 1990. The cholesterol last visit was:   Lab Results  Component Value Date   CHOL 160 10/29/2021   HDL 49 10/29/2021   LDLCALC 90 10/29/2021   TRIG 116 10/29/2021   CHOLHDL 3.3 10/29/2021    Last A1C in the office was:  Lab Results  Component Value Date   HGBA1C 5.4 10/29/2021   He has been drinking lots of water. Last GFR: Lab Results  Component Value  Date   EGFR 69 10/29/2021     Patient is on Vitamin D supplement, 4000 IU.   Lab Results  Component Value Date   VD25OH 31 07/31/2021     Last PSA was: Lab Results  Component Value Date   PSA 1.00 02/26/2021   He has a history of testosterone deficiency, he is not on anything at this time.  Lab Results  Component Value Date   TESTOSTERONE 349 02/26/2021    Current Medications:  Current Outpatient Medications on File Prior to Visit  Medication Sig Dispense Refill   ALPRAZolam (XANAX) 0.5 MG tablet TAKE 1/2 TO 1 TABLET BY MOUTH 1 TO 2 TIMES DAILY ONLY AS NEEDED FOR PANIC OR ANXIETY ATTACK. LIMIT TO 5 DAYS A WEEK TO AVOID ADDITION 30 tablet 0   losartan (COZAAR) 50 MG tablet Take  1 tablet  Daily  for BP                            /             TAKE 1 TABLET BY MOUTH DAILY FOR BLOOD PRESSURE 90 tablet 3   rosuvastatin (CRESTOR) 5 MG tablet Take 1 tablet (5 mg total) by mouth daily. 30 tablet 11   traZODone (DESYREL) 50 MG tablet TAKE 1/2 TO 1 TABLET BY MOUTH DAILY FOR SLEEP 90 tablet 2   venlafaxine XR (EFFEXOR-XR) 75 MG 24 hr capsule TAKE 1 CAPSULE(75 MG) BY MOUTH DAILY 30 capsule 2   cariprazine (VRAYLAR) 1.5 MG capsule Take 1 capsule (1.5 mg total) by mouth daily. (Patient not taking: Reported on 03/04/2022) 30 capsule 3   No current facility-administered medications on file prior to visit.   Allergies:  No Known Allergies   Health Maintenance:  Immunization History  Administered Date(s) Administered   Influenza Inj Mdck Quad With Preservative 02/19/2018   Influenza,inj,Quad PF,6+ Mos 04/30/2021   Influenza,inj,quad, With Preservative 03/05/2016   Tdap 02/19/2018   Health Maintenance  Topic Date Due   COVID-19 Vaccine (1) Never done   HIV Screening  Never done   Hepatitis C Screening  Never done   Zoster Vaccines- Shingrix (1 of 2) Never done   COLONOSCOPY (Pts 45-67yr Insurance coverage will need to be confirmed)  Never done   INFLUENZA VACCINE  01/08/2022    TETANUS/TDAP  02/20/2028   HPV VACCINES  Aged Out   Tetanus: 2019 Pneumovax: Prevnar 13: Flu vaccine: 2021 Zostavax:  DEXA: Colonoscopy: DUE 07/2019 wants to do cologuard EGD: Echo 2011  Patient Care Team: MUnk Pinto MD as PCP - General (Internal Medicine)  Medical History:  has Essential hypertension; Prediabetes; Hyperlipidemia; Medication management; Hypogonadism in  male; Vitamin D deficiency; Erectile dysfunction; Depression, major, recurrent, moderate ([place]); Pulmonary nodules; and Insomnia on their problem list. Surgical History:  He  has a past surgical history that includes Foot fracture surgery (1985). Family History:  His family history includes Cancer in his maternal grandmother; Diabetes in his maternal grandfather and mother; Heart disease in his mother; Hypertension in his mother. Social History:   reports that he quit smoking about 31 years ago. His smoking use included cigarettes. He has never used smokeless tobacco. He reports that he does not drink alcohol and does not use drugs.   Review of Systems:  Review of Systems  Constitutional: Negative.  Negative for chills and fever.  HENT: Negative.  Negative for congestion, hearing loss, sinus pain, sore throat and tinnitus.   Eyes: Negative.  Negative for blurred vision and double vision.  Respiratory: Negative.  Negative for cough, hemoptysis, sputum production, shortness of breath and wheezing.   Cardiovascular: Negative.  Negative for chest pain, palpitations and leg swelling.  Gastrointestinal: Negative.  Negative for abdominal pain, constipation, diarrhea, heartburn, nausea and vomiting.  Genitourinary: Negative.  Negative for dysuria and urgency.  Musculoskeletal:  Negative for back pain, falls, joint pain, myalgias and neck pain.  Skin: Negative.  Negative for rash.  Neurological: Negative.  Negative for dizziness, tingling, tremors, weakness and headaches.  Endo/Heme/Allergies: Negative.  Does not  bruise/bleed easily.  Psychiatric/Behavioral:  Positive for depression (much better controlled with Effexor). Negative for suicidal ideas. The patient has insomnia. The patient is not nervous/anxious.     Physical Exam: Estimated body mass index is 36.68 kg/m as calculated from the following:   Height as of this encounter: 6' 0.5" (1.842 m).   Weight as of this encounter: 274 lb 3.2 oz (124.4 kg). BP 118/88   Pulse 91   Temp 97.7 F (36.5 C)   Ht 6' 0.5" (1.842 m)   Wt 274 lb 3.2 oz (124.4 kg)   SpO2 96%   BMI 36.68 kg/m  General Appearance: Well nourished, in no apparent distress.  Eyes: PERRLA, EOMs, conjunctiva no swelling or erythema, normal fundi and vessels.  Sinuses: No Frontal/maxillary tenderness  ENT/Mouth: Ext aud canals clear, normal light reflex with TMs without erythema, bulging. Good dentition. No erythema, swelling, or exudate on post pharynx. Tonsils not swollen or erythematous. Hearing normal.  Neck: Supple, thyroid normal. No bruits  Respiratory: Respiratory effort normal, BS equal bilaterally without rales, rhonchi, wheezing or stridor.  Cardio: RRR without murmurs, rubs or gallops. Brisk peripheral pulses without edema.  Chest: symmetric, with normal excursions and percussion.  Abdomen: Soft, nontender, no guarding, rebound, hernias, masses, or organomegaly.  Lymphatics: Non tender without lymphadenopathy.  Genitourinary: defer Musculoskeletal: Full ROM all peripheral extremities,5/5 strength, and normal gait.  Skin:. Warm, dry without rashes, lesions, ecchymosis. Neuro: Cranial nerves intact, reflexes equal bilaterally. Normal muscle tone, no cerebellar symptoms. Sensation intact.  Psych: Awake and oriented X 3, normal affect Insight and Judgment appropriate.  EKG: Normal sinus rhythm, no ST changes   Shawndrea Rutkowski [address] 10:22 AM [facility]

## 2022-03-04 ENCOUNTER — Ambulatory Visit (INDEPENDENT_AMBULATORY_CARE_PROVIDER_SITE_OTHER): Payer: 59 | Admitting: Nurse Practitioner

## 2022-03-04 ENCOUNTER — Encounter: Payer: Self-pay | Admitting: Nurse Practitioner

## 2022-03-04 VITALS — BP 118/88 | HR 91 | Temp 97.7°F | Ht 72.5 in | Wt 274.2 lb

## 2022-03-04 DIAGNOSIS — E559 Vitamin D deficiency, unspecified: Secondary | ICD-10-CM

## 2022-03-04 DIAGNOSIS — G4700 Insomnia, unspecified: Secondary | ICD-10-CM

## 2022-03-04 DIAGNOSIS — R918 Other nonspecific abnormal finding of lung field: Secondary | ICD-10-CM

## 2022-03-04 DIAGNOSIS — Z1389 Encounter for screening for other disorder: Secondary | ICD-10-CM

## 2022-03-04 DIAGNOSIS — F419 Anxiety disorder, unspecified: Secondary | ICD-10-CM

## 2022-03-04 DIAGNOSIS — Z0001 Encounter for general adult medical examination with abnormal findings: Secondary | ICD-10-CM

## 2022-03-04 DIAGNOSIS — Z0000 Encounter for general adult medical examination without abnormal findings: Secondary | ICD-10-CM | POA: Diagnosis not present

## 2022-03-04 DIAGNOSIS — Z136 Encounter for screening for cardiovascular disorders: Secondary | ICD-10-CM

## 2022-03-04 DIAGNOSIS — R7309 Other abnormal glucose: Secondary | ICD-10-CM

## 2022-03-04 DIAGNOSIS — Z1329 Encounter for screening for other suspected endocrine disorder: Secondary | ICD-10-CM

## 2022-03-04 DIAGNOSIS — I10 Essential (primary) hypertension: Secondary | ICD-10-CM

## 2022-03-04 DIAGNOSIS — F3341 Major depressive disorder, recurrent, in partial remission: Secondary | ICD-10-CM

## 2022-03-04 DIAGNOSIS — Z79899 Other long term (current) drug therapy: Secondary | ICD-10-CM

## 2022-03-04 DIAGNOSIS — E785 Hyperlipidemia, unspecified: Secondary | ICD-10-CM

## 2022-03-04 DIAGNOSIS — Z125 Encounter for screening for malignant neoplasm of prostate: Secondary | ICD-10-CM

## 2022-03-05 LAB — CBC WITH DIFFERENTIAL/PLATELET
Absolute Monocytes: 495 cells/uL (ref 200–950)
Basophils Absolute: 98 cells/uL (ref 0–200)
Basophils Relative: 1.3 %
Eosinophils Absolute: 330 cells/uL (ref 15–500)
Eosinophils Relative: 4.4 %
HCT: 47.3 % (ref 38.5–50.0)
Hemoglobin: 16.6 g/dL (ref 13.2–17.1)
Lymphs Abs: 1935 cells/uL (ref 850–3900)
MCH: 29.1 pg (ref 27.0–33.0)
MCHC: 35.1 g/dL (ref 32.0–36.0)
MCV: 82.8 fL (ref 80.0–100.0)
MPV: 9.7 fL (ref 7.5–12.5)
Monocytes Relative: 6.6 %
Neutro Abs: 4643 cells/uL (ref 1500–7800)
Neutrophils Relative %: 61.9 %
Platelets: 280 10*3/uL (ref 140–400)
RBC: 5.71 10*6/uL (ref 4.20–5.80)
RDW: 12.4 % (ref 11.0–15.0)
Total Lymphocyte: 25.8 %
WBC: 7.5 10*3/uL (ref 3.8–10.8)

## 2022-03-05 LAB — URINALYSIS, ROUTINE W REFLEX MICROSCOPIC
Bilirubin Urine: NEGATIVE
Glucose, UA: NEGATIVE
Hgb urine dipstick: NEGATIVE
Ketones, ur: NEGATIVE
Leukocytes,Ua: NEGATIVE
Nitrite: NEGATIVE
Protein, ur: NEGATIVE
Specific Gravity, Urine: 1.023 (ref 1.001–1.035)
pH: 5.5 (ref 5.0–8.0)

## 2022-03-05 LAB — COMPLETE METABOLIC PANEL WITH GFR
AG Ratio: 2.3 (calc) (ref 1.0–2.5)
ALT: 32 U/L (ref 9–46)
AST: 25 U/L (ref 10–35)
Albumin: 4.8 g/dL (ref 3.6–5.1)
Alkaline phosphatase (APISO): 75 U/L (ref 35–144)
BUN: 18 mg/dL (ref 7–25)
CO2: 28 mmol/L (ref 20–32)
Calcium: 9.6 mg/dL (ref 8.6–10.3)
Chloride: 102 mmol/L (ref 98–110)
Creat: 1.18 mg/dL (ref 0.70–1.30)
Globulin: 2.1 g/dL (calc) (ref 1.9–3.7)
Glucose, Bld: 99 mg/dL (ref 65–99)
Potassium: 4.7 mmol/L (ref 3.5–5.3)
Sodium: 136 mmol/L (ref 135–146)
Total Bilirubin: 0.7 mg/dL (ref 0.2–1.2)
Total Protein: 6.9 g/dL (ref 6.1–8.1)
eGFR: 74 mL/min/{1.73_m2} (ref >=60)

## 2022-03-05 LAB — LIPID PANEL
Cholesterol: 176 mg/dL (ref <200)
HDL: 53 mg/dL (ref >=40)
LDL Cholesterol (Calc): 101 mg/dL (calc) — ABNORMAL HIGH
Non-HDL Cholesterol (Calc): 123 mg/dL (calc) (ref <130)
Total CHOL/HDL Ratio: 3.3 (calc) (ref <5.0)
Triglycerides: 123 mg/dL (ref <150)

## 2022-03-05 LAB — TSH: TSH: 1.76 mIU/L (ref 0.40–4.50)

## 2022-03-05 LAB — HEMOGLOBIN A1C
Hgb A1c MFr Bld: 5.6 % of total Hgb (ref <5.7)
Mean Plasma Glucose: 114 mg/dL
eAG (mmol/L): 6.3 mmol/L

## 2022-03-05 LAB — PSA: PSA: 1.3 ng/mL (ref <=4.00)

## 2022-03-05 LAB — MICROALBUMIN / CREATININE URINE RATIO
Creatinine, Urine: 202 mg/dL (ref 20–320)
Microalb Creat Ratio: 2 mcg/mg creat (ref <30)
Microalb, Ur: 0.5 mg/dL

## 2022-03-05 LAB — VITAMIN D 25 HYDROXY (VIT D DEFICIENCY, FRACTURES): Vit D, 25-Hydroxy: 34 ng/mL (ref 30–100)

## 2022-03-05 LAB — MAGNESIUM: Magnesium: 2 mg/dL (ref 1.5–2.5)

## 2022-03-10 ENCOUNTER — Other Ambulatory Visit: Payer: Self-pay | Admitting: Nurse Practitioner

## 2022-03-10 DIAGNOSIS — F419 Anxiety disorder, unspecified: Secondary | ICD-10-CM

## 2022-03-25 ENCOUNTER — Ambulatory Visit
Admission: RE | Admit: 2022-03-25 | Discharge: 2022-03-25 | Disposition: A | Discharge status: Home / Self Care | Payer: 59 | Source: Ambulatory Visit | Attending: Nurse Practitioner | Admitting: Nurse Practitioner

## 2022-03-25 DIAGNOSIS — R918 Other nonspecific abnormal finding of lung field: Secondary | ICD-10-CM

## 2022-04-30 ENCOUNTER — Other Ambulatory Visit: Payer: Self-pay

## 2022-04-30 DIAGNOSIS — F3341 Major depressive disorder, recurrent, in partial remission: Secondary | ICD-10-CM

## 2022-04-30 MED ORDER — VENLAFAXINE HCL ER 75 MG PO CP24: ORAL_CAPSULE | ORAL | 2 refills | Status: DC

## 2022-05-31 ENCOUNTER — Other Ambulatory Visit: Payer: Self-pay | Admitting: Nurse Practitioner

## 2022-05-31 DIAGNOSIS — F419 Anxiety disorder, unspecified: Secondary | ICD-10-CM

## 2022-05-31 DIAGNOSIS — G4700 Insomnia, unspecified: Secondary | ICD-10-CM

## 2022-06-05 ENCOUNTER — Other Ambulatory Visit: Payer: Self-pay | Admitting: Nurse Practitioner

## 2022-06-05 DIAGNOSIS — F419 Anxiety disorder, unspecified: Secondary | ICD-10-CM

## 2022-06-05 DIAGNOSIS — G4700 Insomnia, unspecified: Secondary | ICD-10-CM

## 2022-06-05 MED ORDER — ALPRAZOLAM 0.5 MG PO TABS: ORAL_TABLET | ORAL | 0 refills | Status: DC

## 2022-06-05 MED ORDER — TRAZODONE HCL 50 MG PO TABS: ORAL_TABLET | ORAL | 2 refills | Status: DC

## 2022-06-17 ENCOUNTER — Ambulatory Visit: Payer: 59 | Admitting: Nurse Practitioner

## 2022-07-28 ENCOUNTER — Other Ambulatory Visit: Payer: Self-pay | Admitting: Internal Medicine

## 2022-07-28 ENCOUNTER — Other Ambulatory Visit: Payer: Self-pay | Admitting: Nurse Practitioner

## 2022-07-28 DIAGNOSIS — E785 Hyperlipidemia, unspecified: Secondary | ICD-10-CM

## 2022-07-28 DIAGNOSIS — I10 Essential (primary) hypertension: Secondary | ICD-10-CM

## 2022-07-28 MED ORDER — LOSARTAN POTASSIUM 50 MG PO TABS: ORAL_TABLET | ORAL | 3 refills | Status: DC

## 2022-08-12 ENCOUNTER — Ambulatory Visit: Payer: 59 | Admitting: Nurse Practitioner

## 2022-08-16 NOTE — Progress Notes (Unsigned)
Assessment and Plan:    Essential hypertension -     CBC with Differential/Platelet - Restart Losartan 50 mg QD and take regularly -      continue medications, DASH diet, exercise and monitor at home. Call if greater than 130/80.  Follow up in 2 weeks with blood pressure log to evaluate treatment.     Hyperlipidemia, unspecified hyperlipidemia type Continue diet and exercise. Restart Rosuvastatin 5 mg and take regularly -     COMPLETE METABOLIC PANEL WITH GFR -     Lipid panel   Abnormal Glucose Continue diet and exercise -A1c   Vitamin D Deficiency Continue Vit D supplementation to maintain value in therapeutic level of 60-100    Depression, major, recurrent, in partial remission (HCC)       -     Continue Effexor , Add Vraylar 1.5 mg QD       - Follow up in 1 month to evaluate symptoms       - Instructed patient to contact office or on-call physician promptly should condition worsen or any new symptoms appear. IF THE PATIENT HAS ANY SUICIDAL OR HOMICIDAL IDEATIONS, CALL THE OFFICE, DISCUSS WITH A SUPPORT MEMBER, OR GO TO THE ER IMMEDIATELY. Patient was agreeable with this plan.    Medication management       -     Continued   Anxiety       -     Pt using xanax very sparingly, using relaxation techniques and exercise to help control symptoms.   Severe obesity (BMI 35.0-39.9) with comorbidity ([place]) Long discussion about weight loss, diet, and exercise Recommended diet heavy in fruits and veggies and low in animal meats, cheeses, and dairy products, appropriate calorie intake Patient will work on decreasing saturated fats and simple carbs, increase lean protein and exercise Follow up at next visit   Left lower back pain - Meloxicam 7.5 mg qd x 7-14 days - Flexeril 5 mg at bedtime as needed - Monitor symptoms and if no improvement notify the office              Further disposition pending results of labs. Discussed med's effects and SE's.   Over 40 minutes of exam,  counseling, chart review, and critical decision making was performed.   Future Appointments  Date Time Provider [place]  03/05/2023 10:00 AM Alycia Rossetti, NP [facility] None    ------------------------------------------------------------------------------------------------------------------   HPI BP (!) 144/90   Pulse 78   Temp 97.7 F (36.5 C)   Ht 6' 0.5" (1.842 m)   Wt 270 lb 12.8 oz (122.8 kg)   SpO2 98%   BMI 36.22 kg/m  52 y.o.male presents for 6 month follow up of depression, HTN, HLD, obesity   Last week he twisted the wrong way and pulled a muscle in his lower back. Continues to have aching pain in left lower back  Hypertension: He is currently on Losartan 50 mg QD, not taking regularly. Denies headaches, chest pain and shortness of breath. BP Readings from Last 3 Encounters:  08/19/22 (!) 144/90  03/04/22 118/88  10/29/21 140/90    Hyperlipidemia: He is on Rosuvastatin 5 mg, not taking regularly  and his last cholesterol was not at goal.  Has been trying to bring it down with diet.  Lab Results  Component Value Date   CHOL 176 03/04/2022   HDL 53 03/04/2022   LDLCALC 101 (H) 03/04/2022   TRIG 123 03/04/2022   CHOLHDL 3.3 03/04/2022  Obesity: BMI is Body mass index is 36.22 kg/m., he has been working on diet .  Pt has been eating more vegetables, very little red meat and increased water consumption. He has been decreasing sweets in diet. He stress eats a lot at night Wt Readings from Last 3 Encounters:  08/19/22 270 lb 12.8 oz (122.8 kg)  03/04/22 274 lb 3.2 oz (124.4 kg)  10/29/21 275 lb 6.4 oz (124.9 kg)    Depression/Anxiety: He is currently taking Effexor XR 75 mg daily and Xanax 0.'5mg'$  1/2 tab PRN( last filled 06/05/22 )#30  for sleep, panic attacks.  His mood is not great today.  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 Family has been more stress- step kids live with him x 2 , brother- all ask for money.    Past Medical History:  Diagnosis Date   Anxiety    Depression    HLD (hyperlipidemia)    HTN (hypertension)      No Known Allergies  Current Outpatient Medications on File Prior to Visit  Medication Sig   ALPRAZolam (XANAX) 0.5 MG tablet TAKE 1/2 TO 1 TABLET BY MOUTH 1 TO 2 TIMES DAILY AS NEEDED FOR PANIC OR ANXIETY ATTACK. LIMIT 5 DAYS A WEEK TO AVOID ADDICTION   traZODone (DESYREL) 50 MG tablet TAKE 1/2 TO 1 TABLET BY MOUTH DAILY FOR SLEEP   venlafaxine XR (EFFEXOR-XR) 75 MG 24 hr capsule TAKE 1 CAPSULE(75 MG) BY MOUTH DAILY   losartan (COZAAR) 50 MG tablet Take  1 tablet  Daily  for BP                                                                                                   /                                        TAKE                                                    BY                                            MOUTH (Patient not taking: Reported on 08/19/2022)   rosuvastatin (CRESTOR) 5 MG tablet Take  1 tablet  Daily  for Cholesterol (Patient not taking: Reported on 08/19/2022)   No current facility-administered medications on file prior to visit.    Review of Systems  Constitutional:  Negative for chills, fever and weight loss.  HENT:  Negative for congestion and hearing loss.   Eyes:  Negative for blurred vision and double vision.  Respiratory:  Negative for cough, shortness of breath and wheezing.   Cardiovascular:  Negative for chest  pain, palpitations, orthopnea and leg swelling.  Gastrointestinal:  Negative for abdominal pain, constipation, diarrhea, heartburn, nausea and vomiting.  Musculoskeletal:  Negative for falls, joint pain and myalgias.  Skin:  Negative for rash.  Neurological:  Negative for dizziness, tingling, tremors, loss of consciousness and headaches.  Psychiatric/Behavioral:  Positive for depression. Negative for memory loss and suicidal ideas. The patient is nervous/anxious and has insomnia.      Physical Exam:  BP (!) 144/90   Pulse 78    Temp 97.7 F (36.5 C)   Ht 6' 0.5" (1.842 m)   Wt 270 lb 12.8 oz (122.8 kg)   SpO2 98%   BMI 36.22 kg/m   General Appearance: Obese male with flat affect, in no apparent distress. Eyes: PERRLA, EOMs, conjunctiva no swelling or erythema Sinuses: No Frontal/maxillary tenderness ENT/Mouth: Ext aud canals clear, TMs without erythema, bulging. No erythema, swelling, or exudate on post pharynx.  Tonsils not swollen or erythematous. Hearing normal.  Neck: Supple, thyroid normal.  Respiratory: Respiratory effort normal, BS equal bilaterally without rales, rhonchi, wheezing or stridor.  Cardio: RRR with no MRGs. Brisk peripheral pulses without edema.  Abdomen: Soft, + BS.  Non tender, no guarding, rebound, hernias, masses. Lymphatics: Non tender without lymphadenopathy.  Musculoskeletal: Full ROM, 5/5 strength, normal gait. Muscle spasm noted left lower back, pain with palpation Skin: Warm, dry without rashes, lesions, ecchymosis.  Neuro: Cranial nerves intact. Normal muscle tone, no cerebellar symptoms. Sensation intact.  Psych: Awake and oriented X 3, flat affect, Insight and Judgment appropriate.     Alycia Rossetti, NP 11:43 AM [facility]

## 2022-08-19 ENCOUNTER — Encounter: Payer: Self-pay | Admitting: Nurse Practitioner

## 2022-08-19 ENCOUNTER — Ambulatory Visit: Payer: 59 | Admitting: Nurse Practitioner

## 2022-08-19 VITALS — BP 144/90 | HR 78 | Temp 97.7°F | Ht 72.5 in | Wt 270.8 lb

## 2022-08-19 DIAGNOSIS — R7309 Other abnormal glucose: Secondary | ICD-10-CM | POA: Diagnosis not present

## 2022-08-19 DIAGNOSIS — E785 Hyperlipidemia, unspecified: Secondary | ICD-10-CM | POA: Diagnosis not present

## 2022-08-19 DIAGNOSIS — F419 Anxiety disorder, unspecified: Secondary | ICD-10-CM

## 2022-08-19 DIAGNOSIS — E559 Vitamin D deficiency, unspecified: Secondary | ICD-10-CM | POA: Diagnosis not present

## 2022-08-19 DIAGNOSIS — M62830 Muscle spasm of back: Secondary | ICD-10-CM

## 2022-08-19 DIAGNOSIS — I10 Essential (primary) hypertension: Secondary | ICD-10-CM | POA: Diagnosis not present

## 2022-08-19 DIAGNOSIS — F3341 Major depressive disorder, recurrent, in partial remission: Secondary | ICD-10-CM

## 2022-08-19 DIAGNOSIS — Z79899 Other long term (current) drug therapy: Secondary | ICD-10-CM

## 2022-08-19 LAB — COMPLETE METABOLIC PANEL WITH GFR
AG Ratio: 2.1 (calc) (ref 1.0–2.5)
ALT: 30 U/L (ref 9–46)
AST: 25 U/L (ref 10–35)
Albumin: 4.6 g/dL (ref 3.6–5.1)
Alkaline phosphatase (APISO): 78 U/L (ref 35–144)
BUN: 14 mg/dL (ref 7–25)
CO2: 25 mmol/L (ref 20–32)
Calcium: 9.1 mg/dL (ref 8.6–10.3)
Chloride: 107 mmol/L (ref 98–110)
Creat: 1.05 mg/dL (ref 0.70–1.30)
Globulin: 2.2 g/dL (calc) (ref 1.9–3.7)
Glucose, Bld: 96 mg/dL (ref 65–99)
Potassium: 4.5 mmol/L (ref 3.5–5.3)
Sodium: 140 mmol/L (ref 135–146)
Total Bilirubin: 0.7 mg/dL (ref 0.2–1.2)
Total Protein: 6.8 g/dL (ref 6.1–8.1)
eGFR: 85 mL/min/{1.73_m2} (ref >=60)

## 2022-08-19 LAB — CBC WITH DIFFERENTIAL/PLATELET
Absolute Monocytes: 329 cells/uL (ref 200–950)
Basophils Absolute: 32 cells/uL (ref 0–200)
Basophils Relative: 0.7 %
Eosinophils Absolute: 248 cells/uL (ref 15–500)
Eosinophils Relative: 5.5 %
HCT: 44.9 % (ref 38.5–50.0)
Hemoglobin: 15.9 g/dL (ref 13.2–17.1)
Lymphs Abs: 1634 cells/uL (ref 850–3900)
MCH: 29 pg (ref 27.0–33.0)
MCHC: 35.4 g/dL (ref 32.0–36.0)
MCV: 81.8 fL (ref 80.0–100.0)
MPV: 9.3 fL (ref 7.5–12.5)
Monocytes Relative: 7.3 %
Neutro Abs: 2259 cells/uL (ref 1500–7800)
Neutrophils Relative %: 50.2 %
Platelets: 272 10*3/uL (ref 140–400)
RBC: 5.49 10*6/uL (ref 4.20–5.80)
RDW: 12.2 % (ref 11.0–15.0)
Total Lymphocyte: 36.3 %
WBC: 4.5 10*3/uL (ref 3.8–10.8)

## 2022-08-19 LAB — LIPID PANEL
Cholesterol: 176 mg/dL (ref <200)
HDL: 49 mg/dL (ref >=40)
LDL Cholesterol (Calc): 111 mg/dL (calc) — ABNORMAL HIGH
Non-HDL Cholesterol (Calc): 127 mg/dL (calc) (ref <130)
Total CHOL/HDL Ratio: 3.6 (calc) (ref <5.0)
Triglycerides: 71 mg/dL (ref <150)

## 2022-08-19 LAB — VITAMIN D 25 HYDROXY (VIT D DEFICIENCY, FRACTURES): Vit D, 25-Hydroxy: 37 ng/mL (ref 30–100)

## 2022-08-19 LAB — MAGNESIUM: Magnesium: 2.2 mg/dL (ref 1.5–2.5)

## 2022-08-19 MED ORDER — CYCLOBENZAPRINE HCL 5 MG PO TABS: 5.0000 mg | ORAL_TABLET | Freq: Three times a day (TID) | ORAL | 0 refills | Status: DC | PRN

## 2022-08-19 MED ORDER — CARIPRAZINE HCL 1.5 MG PO CAPS: 1.5000 mg | ORAL_CAPSULE | Freq: Every day | ORAL | 3 refills | Status: DC

## 2022-08-19 MED ORDER — MELOXICAM 7.5 MG PO TABS: 7.5000 mg | ORAL_TABLET | Freq: Every day | ORAL | 2 refills | Status: AC

## 2022-09-09 ENCOUNTER — Other Ambulatory Visit: Payer: Self-pay | Admitting: Nurse Practitioner

## 2022-09-09 DIAGNOSIS — F419 Anxiety disorder, unspecified: Secondary | ICD-10-CM

## 2022-09-12 NOTE — Progress Notes (Signed)
Assessment and Plan: Bernard Thompson was seen today for follow-up.  Diagnoses and all orders for this visit:  Hyperlipidemia, unspecified hyperlipidemia type Continue to take Rosuvastatin 5 mg daily- has been forgetting to take at night, advised to start taking in the morning and take consistently. Continue diet and exercise, focus on weight loss  Essential hypertension - continue medications, DASH diet, exercise and monitor at home. Call if greater than 130/80.  - Has been forgetting to take regularly because taking at night, advised to start taking in the am  Depression, major, recurrent, in partial remission Continue effexor and Vraylar daily Continue diet , exercise and weight loss Monitor symptoms       Further disposition pending results of labs. Discussed med's effects and SE's.   Over 30 minutes of exam, counseling, chart review, and critical decision making was performed.   Future Appointments  Date Time Provider Department Center  03/05/2023 10:00 AM Raynelle DickWilkinson, Zadie Deemer E, NP [facility] None    ------------------------------------------------------------------------------------------------------------------   HPI BP 130/86   Pulse 86   Temp 97.7 F (36.5 C)   Ht 6' 0.5" (1.842 m)   Wt 277 lb (125.6 kg)   SpO2 97%   BMI 37.05 kg/m   52 y.o.male presents for reevaluation of mood and blood pressure.   His mood has improved with use of Effexor and vraylar. He feels mood is better, is enjoying more things.  No side effects with medication  BP has improved since restarting Losartan 50 mg QD, occasionally forgets to take.   BP Readings from Last 3 Encounters:  09/16/22 130/86  08/19/22 (!) 144/90  03/04/22 118/88   BMI is Body mass index is 37.05 kg/m., he has not been working on diet and exercise. Wt Readings from Last 3 Encounters:  09/16/22 277 lb (125.6 kg)  08/19/22 270 lb 12.8 oz (122.8 kg)  03/04/22 274 lb 3.2 oz (124.4 kg)   He is on Rosuvastatin 5 mg but  sometimes forgets to take Lab Results  Component Value Date   CHOL 176 08/19/2022   HDL 49 08/19/2022   LDLCALC 111 (H) 08/19/2022   TRIG 71 08/19/2022   CHOLHDL 3.6 08/19/2022     Past Medical History:  Diagnosis Date   Anxiety    Depression    HLD (hyperlipidemia)    HTN (hypertension)      No Known Allergies  Current Outpatient Medications on File Prior to Visit  Medication Sig   albuterol (VENTOLIN HFA) 108 (90 Base) MCG/ACT inhaler Inhale into the lungs.   ALPRAZolam (XANAX) 0.5 MG tablet TAKE 1/2 TO 1 TABLET BY MOUTH ONCE TO TWICE DAILY AS NEEDED FOR PANIC OR ANXIETY ATTACK. LIMIT TO 5 DAYS A WEEK.   cariprazine (VRAYLAR) 1.5 MG capsule Take 1 capsule (1.5 mg total) by mouth daily.   cyclobenzaprine (FLEXERIL) 5 MG tablet Take 1 tablet (5 mg total) by mouth 3 (three) times daily as needed for muscle spasms.   losartan (COZAAR) 50 MG tablet Take  1 tablet  Daily  for BP                                                                                                   /  TAKE                                                    BY                                            MOUTH   meloxicam (MOBIC) 7.5 MG tablet Take 1 tablet (7.5 mg total) by mouth daily.   rosuvastatin (CRESTOR) 5 MG tablet Take  1 tablet  Daily  for Cholesterol   traZODone (DESYREL) 50 MG tablet TAKE 1/2 TO 1 TABLET BY MOUTH DAILY FOR SLEEP   venlafaxine XR (EFFEXOR-XR) 75 MG 24 hr capsule TAKE 1 CAPSULE(75 MG) BY MOUTH DAILY   No current facility-administered medications on file prior to visit.    ROS: all negative except above.   Physical Exam:  BP 130/86   Pulse 86   Temp 97.7 F (36.5 C)   Ht 6' 0.5" (1.842 m)   Wt 277 lb (125.6 kg)   SpO2 97%   BMI 37.05 kg/m   General Appearance: Well nourished, in no apparent distress. Eyes: PERRLA, EOMs, conjunctiva no swelling or erythema Sinuses: No Frontal/maxillary tenderness ENT/Mouth: Ext aud canals clear, TMs  without erythema, bulging. No erythema, swelling, or exudate on post pharynx.  Tonsils not swollen or erythematous. Hearing normal.  Neck: Supple, thyroid normal.  Respiratory: Respiratory effort normal, BS equal bilaterally without rales, rhonchi, wheezing or stridor.  Cardio: RRR with no MRGs. Brisk peripheral pulses without edema.  Abdomen: Soft, + BS.  Non tender, no guarding, rebound, hernias, masses. Lymphatics: Non tender without lymphadenopathy.  Musculoskeletal: Full ROM, 5/5 strength, normal gait.  Skin: Warm, dry without rashes, lesions, ecchymosis.  Neuro: Cranial nerves intact. Normal muscle tone, no cerebellar symptoms. Sensation intact.  Psych: Awake and oriented X 3, normal affect, Insight and Judgment appropriate.     Raynelle DickANA E Terrace Chiem, NP 11:47 AM [facility]

## 2022-09-16 ENCOUNTER — Encounter: Payer: Self-pay | Admitting: Nurse Practitioner

## 2022-09-16 ENCOUNTER — Ambulatory Visit: Payer: 59 | Admitting: Nurse Practitioner

## 2022-09-16 VITALS — BP 130/86 | HR 86 | Temp 97.7°F | Ht 72.5 in | Wt 277.0 lb

## 2022-09-16 DIAGNOSIS — F3341 Major depressive disorder, recurrent, in partial remission: Secondary | ICD-10-CM | POA: Diagnosis not present

## 2022-09-16 DIAGNOSIS — E785 Hyperlipidemia, unspecified: Secondary | ICD-10-CM | POA: Diagnosis not present

## 2022-09-16 DIAGNOSIS — I10 Essential (primary) hypertension: Secondary | ICD-10-CM

## 2022-10-10 ENCOUNTER — Encounter: Payer: Self-pay | Admitting: Nurse Practitioner

## 2022-10-10 ENCOUNTER — Other Ambulatory Visit: Payer: Self-pay | Admitting: Nurse Practitioner

## 2022-10-22 ENCOUNTER — Other Ambulatory Visit: Payer: Self-pay | Admitting: Nurse Practitioner

## 2022-10-22 DIAGNOSIS — F3341 Major depressive disorder, recurrent, in partial remission: Secondary | ICD-10-CM

## 2022-11-06 ENCOUNTER — Ambulatory Visit: Payer: 59 | Admitting: Nurse Practitioner

## 2022-11-06 ENCOUNTER — Encounter: Payer: Self-pay | Admitting: Nurse Practitioner

## 2022-11-06 VITALS — BP 140/100 | HR 85 | Temp 97.6°F | Ht 72.5 in | Wt 286.2 lb

## 2022-11-06 DIAGNOSIS — K5792 Diverticulitis of intestine, part unspecified, without perforation or abscess without bleeding: Secondary | ICD-10-CM | POA: Diagnosis not present

## 2022-11-06 DIAGNOSIS — R1032 Left lower quadrant pain: Secondary | ICD-10-CM | POA: Diagnosis not present

## 2022-11-06 DIAGNOSIS — R112 Nausea with vomiting, unspecified: Secondary | ICD-10-CM

## 2022-11-06 DIAGNOSIS — R197 Diarrhea, unspecified: Secondary | ICD-10-CM | POA: Diagnosis not present

## 2022-11-06 DIAGNOSIS — I10 Essential (primary) hypertension: Secondary | ICD-10-CM

## 2022-11-06 DIAGNOSIS — R509 Fever, unspecified: Secondary | ICD-10-CM

## 2022-11-06 DIAGNOSIS — R6883 Chills (without fever): Secondary | ICD-10-CM

## 2022-11-06 MED ORDER — METRONIDAZOLE 500 MG PO TABS: 500.0000 mg | ORAL_TABLET | Freq: Four times a day (QID) | ORAL | 0 refills | Status: DC

## 2022-11-06 MED ORDER — CIPROFLOXACIN HCL 750 MG PO TABS: 750.0000 mg | ORAL_TABLET | Freq: Two times a day (BID) | ORAL | 0 refills | Status: DC

## 2022-11-06 NOTE — Patient Instructions (Signed)
Diverticulitis  Diverticulitis is when small pouches in your colon get infected or swollen. This causes pain in your belly (abdomen) and watery poop (diarrhea). The small pouches are called diverticula. They may form if you have a condition called diverticulosis. What are the causes? You may get this condition if poop (stool) gets trapped in the pouches in your colon. The poop lets germs (bacteria) grow. This causes an infection. What increases the risk? You are more likely to get this condition if you have small pouches in your colon. You are also more likely to get it if: You are overweight or very overweight (obese). You do not exercise enough. You drink alcohol. You smoke. You eat a lot of red meat, like beef, pork, or lamb. You do not eat enough fiber. You are older than 53 years of age. What are the signs or symptoms? Pain in your belly. Pain is often on the left side, but it may be felt in other spots too. Fever and chills. Feeling like you may vomit. Vomiting. Having cramps. Feeling full. Changes in how often you poop. Blood in your poop. How is this treated? Most cases are treated at home. You may be told to: Take over-the-counter pain medicines. Only eat and drink clear liquids. Take antibiotics. Rest. Very bad cases may need to be treated at a hospital. Treatment may include: Not eating or drinking. Taking pain medicines. Getting antibiotics through an IV tube. Getting fluid and food through an IV tube. Having surgery. When you are feeling better, you may need to have a test to look at your colon (colonoscopy). Follow these instructions at home: Medicines Take over-the-counter and prescription medicines only as told by your doctor. These include: Fiber pills. Probiotics. Medicines to make your poop soft (stool softeners). If you were prescribed antibiotics, take them as told by your doctor. Do not stop taking them even if you start to feel better. Ask your  doctor if you should avoid driving or using machines while you are taking your medicine. Eating and drinking  Follow the diet told by your doctor. You may need to only eat and drink liquids. When you feel better, you may be able to eat more foods. You may also be told to eat a lot of fiber. Fiber helps you poop. Foods with fiber include berries, beans, lentils, and green vegetables. Try not to eat red meat. General instructions Do not smoke or use any products that contain nicotine or tobacco. If you need help quitting, ask your doctor. Exercise 3 or more times a week. Try to go for 30 minutes each time. Exercise enough to sweat and make your heart beat faster. Contact a doctor if: Your pain gets worse. You are not pooping like normal. Your symptoms do not get better. Your symptoms get worse very fast. You have a fever. You vomit more than one time. You have poop that is: Bloody. Black. Tarry. This information is not intended to replace advice given to you by your health care provider. Make sure you discuss any questions you have with your health care provider. Document Revised: 02/21/2022 Document Reviewed: 02/21/2022 Elsevier Patient Education  2024 [employer].

## 2022-11-06 NOTE — Progress Notes (Signed)
Assessment and Plan:  Bernard Thompson was seen today for an episodic visit.  Diagnoses and all order for this visit:  1. Diverticulitis CT scan if s/s fail to improve. Continue to monitor for increase in pain, N/V, fever, chills, or no response to treatment and report to ER. Obtain CBC to assess WBC and degree of infection Start tmt with Cipro and Flagyl as directed.  - CBC with Differential/Platelet - ciprofloxacin (CIPRO) 750 MG tablet; Take 1 tablet (750 mg total) by mouth 2 (two) times daily.  Dispense: 14 tablet; Refill: 0 - metroNIDAZOLE (FLAGYL) 500 MG tablet; Take 1 tablet (500 mg total) by mouth 4 (four) times daily.  Dispense: 28 tablet; Refill: 0  2. Diarrhea of presumed infectious origin Stay well hydrated - clear liquid diet Monitor electrolytes  - COMPLETE METABOLIC PANEL WITH GFR  3. Nausea and vomiting, unspecified vomiting type Stay well hydrated - clear liquid diet Monitor electrolytes  - COMPLETE METABOLIC PANEL WITH GFR  4. LLQ pain Continue to monitor for increase in pain. Take Tylenol versus Ibuprofen/NSAID for tmt of pain or fever.   5. Low grade fever/Chills Continue to monitor for increase in fever. Take Tylenol versus Ibuprofen/NSAID for tmt of pain or fever.  Monitor CBC   - CBC with Differential/Platelet  6.  Hypertension Elevated in clinic - asymptomatic - secondary to pain. Discussed DASH (Dietary Approaches to Stop Hypertension) DASH diet is lower in sodium than a typical American diet. Cut back on foods that are high in saturated fat, cholesterol, and trans fats. Eat more whole-grain foods, fish, poultry, and nuts Remain active and exercise as tolerated daily.  Monitor BP at home-Call if greater than 130/80.  Check CMP/CBC   Notify office for further evaluation and treatment, questions or concerns if s/s fail to improve. The risks and benefits of my recommendations, as well as other treatment options were discussed with the patient today.  Questions were answered.  Further disposition pending results of labs. Discussed med's effects and SE's.    Over 20 minutes of exam, counseling, chart review, and critical decision making was performed.   Future Appointments  Date Time Provider Department Center  03/05/2023 10:00 AM Raynelle Dick, NP [facility] None    ------------------------------------------------------------------------------------------------------------------   HPI BP (!) 140/100   Pulse 85   Temp 97.6 F (36.4 C)   Ht 6' 0.5" (1.842 m)   Wt 286 lb 3.2 oz (129.8 kg)   SpO2 98%   BMI 38.28 kg/m   Patient complains of chills, diarrhea, diffuse abdominal pain, fever, LLQ abdominal pain, nausea, and vomiting. The pain is described as aching and colicky, and is a 5/10 in intensity. Onset was 1 weeks ago. Symptoms have been intermittent since that time. Associated symptoms: anorexia. Aggravating factors: activity and eating. Alleviating factors: resting. The patient denies hematochezia, melena, and mucus in the stool.  Tmax 100.7 F.    Past Medical History:  Diagnosis Date   Anxiety    Depression    HLD (hyperlipidemia)    HTN (hypertension)      No Known Allergies  Current Outpatient Medications on File Prior to Visit  Medication Sig   albuterol (VENTOLIN HFA) 108 (90 Base) MCG/ACT inhaler Inhale into the lungs.   ALPRAZolam (XANAX) 0.5 MG tablet TAKE 1/2 TO 1 TABLET BY MOUTH ONCE TO TWICE DAILY AS NEEDED FOR PANIC OR ANXIETY ATTACK. LIMIT TO 5 DAYS A WEEK.   cariprazine (VRAYLAR) 1.5 MG capsule Take 1 capsule (1.5 mg total)  by mouth daily.   cyclobenzaprine (FLEXERIL) 5 MG tablet Take 1 tablet (5 mg total) by mouth 3 (three) times daily as needed for muscle spasms.   losartan (COZAAR) 50 MG tablet Take  1 tablet  Daily  for BP                                                                                                   /                                        TAKE                                                     BY                                            MOUTH   rosuvastatin (CRESTOR) 5 MG tablet Take  1 tablet  Daily  for Cholesterol   traZODone (DESYREL) 50 MG tablet TAKE 1/2 TO 1 TABLET BY MOUTH DAILY FOR SLEEP   venlafaxine XR (EFFEXOR-XR) 75 MG 24 hr capsule TAKE 1 CAPSULE(75 MG) BY MOUTH DAILY   meloxicam (MOBIC) 7.5 MG tablet Take 1 tablet (7.5 mg total) by mouth daily. (Patient not taking: Reported on 11/06/2022)   No current facility-administered medications on file prior to visit.    ROS: all negative except what is noted in the HPI.   Physical Exam:  BP (!) 140/100   Pulse 85   Temp 97.6 F (36.4 C)   Ht 6' 0.5" (1.842 m)   Wt 286 lb 3.2 oz (129.8 kg)   SpO2 98%   BMI 38.28 kg/m   General Appearance: NAD.  Awake, conversant and cooperative. Eyes: PERRLA, EOMs intact.  Sclera white.  Conjunctiva without erythema. Sinuses: No frontal/maxillary tenderness.  No nasal discharge. Nares patent.  ENT/Mouth: Ext aud canals clear.  Bilateral TMs w/DOL and without erythema or bulging. Hearing intact.  Posterior pharynx without swelling or exudate.  Tonsils without swelling or erythema.  Neck: Supple.  No masses, nodules or thyromegaly. Respiratory: Effort is regular with non-labored breathing. Breath sounds are equal bilaterally without rales, rhonchi, wheezing or stridor.  Cardio: RRR with no MRGs. Brisk peripheral pulses without edema.  Abdomen: Active BS in all four quadrants.  Soft and non-tender without guarding, rebound tenderness, hernias or masses. Lymphatics: Non tender without lymphadenopathy.  Musculoskeletal: Full ROM, 5/5 strength, normal ambulation.  No clubbing or cyanosis. Skin: Appropriate color for ethnicity. Warm without rashes, lesions, ecchymosis, ulcers.  Neuro: CN II-XII grossly normal. Normal muscle tone without cerebellar symptoms and intact sensation.   Psych: AO X 3,  appropriate mood and affect, insight and judgment.     Bernard Thompson  Bernard Choung,  NP 2:47 PM [facility]

## 2022-11-07 LAB — CBC WITH DIFFERENTIAL/PLATELET
Absolute Monocytes: 530 cells/uL (ref 200–950)
Basophils Absolute: 78 cells/uL (ref 0–200)
Basophils Relative: 1 %
Eosinophils Absolute: 671 cells/uL — ABNORMAL HIGH (ref 15–500)
Eosinophils Relative: 8.6 %
HCT: 40.3 % (ref 38.5–50.0)
Hemoglobin: 14.3 g/dL (ref 13.2–17.1)
Lymphs Abs: 2153 cells/uL (ref 850–3900)
MCH: 29.2 pg (ref 27.0–33.0)
MCHC: 35.5 g/dL (ref 32.0–36.0)
MCV: 82.2 fL (ref 80.0–100.0)
MPV: 9.6 fL (ref 7.5–12.5)
Monocytes Relative: 6.8 %
Neutro Abs: 4368 cells/uL (ref 1500–7800)
Neutrophils Relative %: 56 %
Platelets: 258 10*3/uL (ref 140–400)
RBC: 4.9 10*6/uL (ref 4.20–5.80)
RDW: 12.6 % (ref 11.0–15.0)
Total Lymphocyte: 27.6 %
WBC: 7.8 10*3/uL (ref 3.8–10.8)

## 2022-11-07 LAB — COMPLETE METABOLIC PANEL WITH GFR
AG Ratio: 2 (calc) (ref 1.0–2.5)
ALT: 40 U/L (ref 9–46)
AST: 24 U/L (ref 10–35)
Albumin: 4.5 g/dL (ref 3.6–5.1)
Alkaline phosphatase (APISO): 80 U/L (ref 35–144)
BUN: 13 mg/dL (ref 7–25)
CO2: 25 mmol/L (ref 20–32)
Calcium: 9.4 mg/dL (ref 8.6–10.3)
Chloride: 105 mmol/L (ref 98–110)
Creat: 1.17 mg/dL (ref 0.70–1.30)
Globulin: 2.3 g/dL (calc) (ref 1.9–3.7)
Glucose, Bld: 80 mg/dL (ref 65–99)
Potassium: 4.3 mmol/L (ref 3.5–5.3)
Sodium: 138 mmol/L (ref 135–146)
Total Bilirubin: 1 mg/dL (ref 0.2–1.2)
Total Protein: 6.8 g/dL (ref 6.1–8.1)
eGFR: 75 mL/min/{1.73_m2} (ref >=60)

## 2022-11-10 ENCOUNTER — Encounter: Payer: Self-pay | Admitting: Nurse Practitioner

## 2022-11-11 ENCOUNTER — Encounter: Payer: Self-pay | Admitting: Nurse Practitioner

## 2022-12-24 ENCOUNTER — Other Ambulatory Visit: Payer: Self-pay | Admitting: Nurse Practitioner

## 2022-12-24 DIAGNOSIS — F3341 Major depressive disorder, recurrent, in partial remission: Secondary | ICD-10-CM

## 2023-01-08 ENCOUNTER — Encounter: Payer: Self-pay | Admitting: Nurse Practitioner

## 2023-01-09 ENCOUNTER — Other Ambulatory Visit: Payer: Self-pay | Admitting: Nurse Practitioner

## 2023-01-09 DIAGNOSIS — Z1211 Encounter for screening for malignant neoplasm of colon: Secondary | ICD-10-CM

## 2023-01-14 ENCOUNTER — Other Ambulatory Visit: Payer: Self-pay | Admitting: Nurse Practitioner

## 2023-01-14 DIAGNOSIS — F419 Anxiety disorder, unspecified: Secondary | ICD-10-CM

## 2023-01-20 ENCOUNTER — Other Ambulatory Visit: Payer: Self-pay | Admitting: Nurse Practitioner

## 2023-01-20 ENCOUNTER — Other Ambulatory Visit: Payer: Self-pay | Admitting: Internal Medicine

## 2023-01-20 DIAGNOSIS — F3341 Major depressive disorder, recurrent, in partial remission: Secondary | ICD-10-CM

## 2023-01-20 DIAGNOSIS — E785 Hyperlipidemia, unspecified: Secondary | ICD-10-CM

## 2023-02-24 ENCOUNTER — Ambulatory Visit: Payer: 59 | Admitting: *Deleted

## 2023-02-24 VITALS — Ht 72.5 in | Wt 266.0 lb

## 2023-02-24 DIAGNOSIS — Z1211 Encounter for screening for malignant neoplasm of colon: Secondary | ICD-10-CM

## 2023-02-24 MED ORDER — NA SULFATE-K SULFATE-MG SULF 17.5-3.13-1.6 GM/177ML PO SOLN: 1.0000 | Freq: Once | ORAL | 0 refills | Status: AC

## 2023-02-24 NOTE — Progress Notes (Signed)
Pt's name and DOB verified at the beginning of the pre-visit.  Pt denies any difficulty with ambulating,sitting, laying down or rolling side to side Gave both [facility] main # and MD on call # prior to instructions.  No egg or soy allergy known to patient  No issues known to pt with past sedation with any surgeries or procedures Pt denies having issues being intubated Pt has no issues moving head neck or swallowing No FH of Malignant Hyperthermia Pt is not on diet pills Pt is not on home 02  Pt is not on blood thinners  Pt denies issues with constipation  Pt is not on dialysis Pt denies any upcoming cardiac testing Pt encouraged to use to use Singlecare or Goodrx to reduce cost  Patient's chart reviewed by Cathlyn Parsons CNRA prior to pre-visit and patient appropriate for the [facility].  Pre-visit completed and red dot placed by patient's name on their procedure day (on provider's schedule).  . Visit by phone Pt scale weight is 266 lb Instructed pt why it is important to and  to call if they have any changes in health or new medications. Directed them to the # given and on instructions.   Pt states they will.  Instructions reviewed with pt and pt states understanding. Instructed to review again prior to procedure. Pt states they will.  Instruction given to pt with coupon and by my chart

## 2023-02-27 ENCOUNTER — Other Ambulatory Visit: Payer: Self-pay | Admitting: Nurse Practitioner

## 2023-02-27 DIAGNOSIS — G4700 Insomnia, unspecified: Secondary | ICD-10-CM

## 2023-03-03 ENCOUNTER — Encounter: Payer: Self-pay | Admitting: Gastroenterology

## 2023-03-04 NOTE — Progress Notes (Unsigned)
Complete Physical  Assessment and Plan: Bernard Thompson was seen today for annual exam.  Diagnoses and all orders for this visit:  Essential hypertension - Continue Losartan 50 mg QD  -  continue DASH diet, exercise and monitor at home. Call if greater than 130/80.    Hyperlipidemia, unspecified hyperlipidemia type - Continue Rosuvastatin 5 mg every day, diet and exercise - CMP - Lipid  Depression, major, recurrent, in partial remission (HCC)  Will refer to counseling Discussed go to ER or call office if any thought of self harm or thoughts of harming others.  Pt to follow up in 2 months to reevaluate or sooner if needed    Severe obesity (BMI 35.0-39.9) with comorbidity (HCC) Long discussion about weight loss, diet, and exercise Recommended diet heavy in fruits and veggies and low in animal meats, cheeses, and dairy products, appropriate calorie intake Patient will work on decreasing saturated fats, simple carbs and increasing exercise and lean protein Follow up at next visit   Insomnia, unspecified type  Continue Trazodone and practicing good sleep behaviors  Abnormal glucose - Continue diet and exercise. Focus on weight loss - A1c  Abnormal CT of the chest 03/25/22 repeat CT suggest follow up in 2 years  Screening PSA (prostate specific antigen) -     PSA  Hypogonadism       - Testosterone  Vitamin D deficiency Currently not taking supplementation -     VITAMIN D 25 Hydroxy (Vit-D Deficiency, Fractures)  Medication management -     CBC with Differential/Platelet -     COMPLETE METABOLIC PANEL WITH GFR -     Magnesium -     Hemoglobin A1c -     VITAMIN D 25 Hydroxy (Vit-D Deficiency, Fractures) -     PSA -     Testosterone -     Urinalysis, Routine w reflex microscopic -     Microalbumin / creatinine urine ratio -     EKG 12-Lead  Screening for ischemic heart disease -     EKG 12-Lead  Screening for hematuria or proteinuria -     Urinalysis, Routine w reflex  microscopic -     Microalbumin / creatinine urine ratio   Future Appointments  Date Time Provider Department Center  03/17/2023  9:00 AM Shellia Cleverly, DO [facility]  03/04/2024 10:00 AM Raynelle Dick, NP [facility] None      Discussed med's effects and SE's. Screening labs and tests as requested with regular follow-up as recommended. Over 40 minutes of exam, counseling, chart review and critical decision making was performed  HPI Patient presents for a complete physical. has Essential hypertension; Prediabetes; Hyperlipidemia; Medication management; Hypogonadism in male; Vitamin D deficiency; Erectile dysfunction; Depression, major, recurrent, moderate (HCC); Pulmonary nodules; and Insomnia on their problem list.   His blood pressure has been controlled on Losartan 50 mg every day , today their BP is BP: 108/80  BP Readings from Last 3 Encounters:  03/05/23 108/80  11/06/22 (!) 140/100  09/16/22 130/86  He does not workout. He denies chest pain, shortness of breath, dizziness.   BMI is Body mass index is 36.4 kg/m., he has not been working on diet and exercise. He is down 18 pounds from 10/2022.  He is eating healthier, fruits and vegetables, drinking more water. No real exercise currently Wt Readings from Last 3 Encounters:  03/05/23 268 lb 6.4 oz (121.7 kg)  02/24/23 266 lb (120.7 kg)  11/06/22 286 lb 3.2 oz (129.8 kg)  He feels like his mood is controlled on the Effexor 75 mg every day and Vraylar 1.5 mg every day . He also does use Trazodone 50 mg 1/2 -1 tab for sleep.  Will rarely use Alprazolam 0.5 mg, usually once or twice a week. Last filled 01/15/23 #30.  He has been having right hip pain and has been using Meloxicam with relief.    During a ER visit he had CTA that showed abnormal Small triangular shaped area of ground-glass opacity in the superior aspect of the right lower lobe and minimal similar densityin the right lower lobe. These are most likely  inflammatory in nature.. 6 pack a year smoking hx, quit 1990. Repeat CT 03/25/22.  Stable tiny pulmonary nodules, largest is a 6 mm ground-glass left lower lobe nodule. Per Fleischner guidelines, chest CT follow-up is recommended in 2 years. 2. Calcification in the right coronary artery. 3. Hepatic steatosis.   He is on cholesterol medication, Rosuvastatin 5 mg daily- restarted after 08/2022 visit and denies myalgias.His cholesterol is not at goal. Mom with CAD at 75.  6 pack a year smoking hx, quit 1990. The cholesterol last visit was:   Lab Results  Component Value Date   CHOL 176 08/19/2022   HDL 49 08/19/2022   LDLCALC 111 (H) 08/19/2022   TRIG 71 08/19/2022   CHOLHDL 3.6 08/19/2022    Last A1C in the office was:  Lab Results  Component Value Date   HGBA1C 5.6 03/04/2022   He has been drinking lots of water. Last GFR: Lab Results  Component Value Date   EGFR 75 11/06/2022     Patient is on Vitamin D supplement, 4000 IU.   Lab Results  Component Value Date   VD25OH 37 08/19/2022     Last PSA was: Lab Results  Component Value Date   PSA 1.30 03/04/2022   He has a history of testosterone deficiency, he is not on anything at this time.  Lab Results  Component Value Date   TESTOSTERONE 349 02/26/2021    Current Medications:  Current Outpatient Medications on File Prior to Visit  Medication Sig Dispense Refill   ALPRAZolam (XANAX) 0.5 MG tablet TAKE 1/2 TO 1 TABLET BY MOUTH ONCE TO TWICE DAILY AS NEEDED FOR PANIC OR ANXIETY ATTACK; LIMIT TO 5 DAYS PER WEEK. 30 tablet 0   losartan (COZAAR) 50 MG tablet Take  1 tablet  Daily  for BP                                                                                                   /                                        TAKE  BY                                            MOUTH 90 tablet 3   meloxicam (MOBIC) 7.5 MG tablet Take 1 tablet (7.5 mg total) by mouth daily. 30  tablet 2   rosuvastatin (CRESTOR) 5 MG tablet TAKE 1 TABLET BY MOUTH DAILY FOR CHOLESTEROL 90 tablet 1   traZODone (DESYREL) 50 MG tablet TAKE 1/2 TO 1 TABLET BY MOUTH DAILY FOR SLEEP 90 tablet 2   venlafaxine XR (EFFEXOR-XR) 75 MG 24 hr capsule TAKE 1 CAPSULE(75 MG) BY MOUTH DAILY 30 capsule 2   VRAYLAR 1.5 MG capsule TAKE 1 CAPSULE(1.5 MG) BY MOUTH DAILY 30 capsule 3   No current facility-administered medications on file prior to visit.   Allergies:  No Known Allergies   Health Maintenance:  Immunization History  Administered Date(s) Administered   Influenza Inj Mdck Quad With Preservative 02/19/2018   Influenza,inj,Quad PF,6+ Mos 04/30/2021   Influenza,inj,quad, With Preservative 03/05/2016   Tdap 02/19/2018   Health Maintenance  Topic Date Due   COVID-19 Vaccine (1) Never done   HIV Screening  Never done   Hepatitis C Screening  Never done   Zoster Vaccines- Shingrix (1 of 2) Never done   Colonoscopy  Never done   INFLUENZA VACCINE  01/09/2023   DTaP/Tdap/Td (2 - Td or Tdap) 02/20/2028   HPV VACCINES  Aged Out    Colonoscopy: Scheduled for October   Patient Care Team: Lucky Cowboy, MD as PCP - General (Internal Medicine)  Medical History:  has Essential hypertension; Prediabetes; Hyperlipidemia; Medication management; Hypogonadism in male; Vitamin D deficiency; Erectile dysfunction; Depression, major, recurrent, moderate (HCC); Pulmonary nodules; and Insomnia on their problem list. Surgical History:  He  has a past surgical history that includes Foot fracture surgery (1985). Family History:  His family history includes Cancer in his maternal grandmother; Diabetes in his maternal grandfather and mother; Heart disease in his mother; Hypertension in his mother. Social History:   reports that he quit smoking about 32 years ago. His smoking use included cigarettes. He has never used smokeless tobacco. He reports that he does not drink alcohol and does not use drugs.    Review of Systems:  Review of Systems  Constitutional: Negative.  Negative for chills and fever.  HENT: Negative.  Negative for congestion, hearing loss, sinus pain, sore throat and tinnitus.   Eyes: Negative.  Negative for blurred vision and double vision.  Respiratory: Negative.  Negative for cough, hemoptysis, sputum production, shortness of breath and wheezing.   Cardiovascular: Negative.  Negative for chest pain, palpitations and leg swelling.  Gastrointestinal: Negative.  Negative for abdominal pain, constipation, diarrhea, heartburn, nausea and vomiting.  Genitourinary: Negative.  Negative for dysuria and urgency.  Musculoskeletal:  Negative for back pain, falls, joint pain, myalgias and neck pain.  Skin: Negative.  Negative for rash.  Neurological: Negative.  Negative for dizziness, tingling, tremors, weakness and headaches.  Endo/Heme/Allergies: Negative.  Does not bruise/bleed easily.  Psychiatric/Behavioral:  Positive for depression (much better controlled with Effexor and Vraylar). Negative for suicidal ideas. The patient is nervous/anxious and has insomnia.     Physical Exam: Estimated body mass index is 36.4 kg/m as calculated from the following:   Height as of this encounter: 6' (1.829 m).   Weight as of this encounter: 268 lb 6.4 oz (121.7  kg). BP 108/80   Pulse 81   Temp (!) 97.5 F (36.4 C)   Ht 6' (1.829 m)   Wt 268 lb 6.4 oz (121.7 kg)   SpO2 95%   BMI 36.40 kg/m  General Appearance: Well nourished, in no apparent distress.  Eyes: PERRLA, EOMs, conjunctiva no swelling or erythema, normal fundi and vessels.  Sinuses: No Frontal/maxillary tenderness  ENT/Mouth: Ext aud canals clear, normal light reflex with TMs without erythema, bulging. Good dentition. No erythema, swelling, or exudate on post pharynx. Hearing normal.  Neck: Supple, thyroid normal. No bruits  Respiratory: Respiratory effort normal, BS equal bilaterally without rales, rhonchi, wheezing or  stridor.  Cardio: RRR without murmurs, rubs or gallops. Brisk peripheral pulses without edema.  Chest: symmetric, with normal excursions and percussion.  Abdomen: Soft, nontender, no guarding, rebound, hernias, masses, or organomegaly.  Lymphatics: Non tender without lymphadenopathy.  Genitourinary: defer Musculoskeletal: Full ROM all peripheral extremities,5/5 strength, and normal gait.  Skin:. Warm, dry without rashes, lesions, ecchymosis. Neuro: Cranial nerves intact, reflexes equal bilaterally. Normal muscle tone, no cerebellar symptoms. Sensation intact.  Psych: Awake and oriented X 3, normal affect Insight and Judgment appropriate.  EKG: Normal sinus rhythm, no ST changes AAA: < 3 cm  Mohogany Toppins E Denisa Enterline 10:28 AM [facility]

## 2023-03-05 ENCOUNTER — Ambulatory Visit (INDEPENDENT_AMBULATORY_CARE_PROVIDER_SITE_OTHER): Payer: 59 | Admitting: Nurse Practitioner

## 2023-03-05 ENCOUNTER — Encounter: Payer: Self-pay | Admitting: Nurse Practitioner

## 2023-03-05 VITALS — BP 108/80 | HR 81 | Temp 97.5°F | Ht 72.0 in | Wt 268.4 lb

## 2023-03-05 DIAGNOSIS — E785 Hyperlipidemia, unspecified: Secondary | ICD-10-CM

## 2023-03-05 DIAGNOSIS — E291 Testicular hypofunction: Secondary | ICD-10-CM

## 2023-03-05 DIAGNOSIS — I10 Essential (primary) hypertension: Secondary | ICD-10-CM | POA: Diagnosis not present

## 2023-03-05 DIAGNOSIS — R7309 Other abnormal glucose: Secondary | ICD-10-CM

## 2023-03-05 DIAGNOSIS — Z1389 Encounter for screening for other disorder: Secondary | ICD-10-CM

## 2023-03-05 DIAGNOSIS — I700 Atherosclerosis of aorta: Secondary | ICD-10-CM | POA: Diagnosis not present

## 2023-03-05 DIAGNOSIS — Z0000 Encounter for general adult medical examination without abnormal findings: Secondary | ICD-10-CM

## 2023-03-05 DIAGNOSIS — R918 Other nonspecific abnormal finding of lung field: Secondary | ICD-10-CM

## 2023-03-05 DIAGNOSIS — G4700 Insomnia, unspecified: Secondary | ICD-10-CM

## 2023-03-05 DIAGNOSIS — Z79899 Other long term (current) drug therapy: Secondary | ICD-10-CM

## 2023-03-05 DIAGNOSIS — F3341 Major depressive disorder, recurrent, in partial remission: Secondary | ICD-10-CM

## 2023-03-05 DIAGNOSIS — Z125 Encounter for screening for malignant neoplasm of prostate: Secondary | ICD-10-CM

## 2023-03-05 DIAGNOSIS — Z23 Encounter for immunization: Secondary | ICD-10-CM

## 2023-03-05 DIAGNOSIS — Z1329 Encounter for screening for other suspected endocrine disorder: Secondary | ICD-10-CM

## 2023-03-05 DIAGNOSIS — Z136 Encounter for screening for cardiovascular disorders: Secondary | ICD-10-CM

## 2023-03-05 DIAGNOSIS — F419 Anxiety disorder, unspecified: Secondary | ICD-10-CM

## 2023-03-05 DIAGNOSIS — E559 Vitamin D deficiency, unspecified: Secondary | ICD-10-CM

## 2023-03-05 DIAGNOSIS — Z0001 Encounter for general adult medical examination with abnormal findings: Secondary | ICD-10-CM

## 2023-03-05 NOTE — Patient Instructions (Signed)

## 2023-03-06 LAB — MICROALBUMIN / CREATININE URINE RATIO
Creatinine, Urine: 195 mg/dL (ref 20–320)
Microalb Creat Ratio: 3 mg/g creat (ref <30)
Microalb, Ur: 0.6 mg/dL

## 2023-03-06 LAB — CBC WITH DIFFERENTIAL/PLATELET
Absolute Monocytes: 432 cells/uL (ref 200–950)
Basophils Absolute: 78 cells/uL (ref 0–200)
Basophils Relative: 1.3 %
Eosinophils Absolute: 234 cells/uL (ref 15–500)
Eosinophils Relative: 3.9 %
HCT: 47 % (ref 38.5–50.0)
Hemoglobin: 16 g/dL (ref 13.2–17.1)
Lymphs Abs: 1704 cells/uL (ref 850–3900)
MCH: 29.1 pg (ref 27.0–33.0)
MCHC: 34 g/dL (ref 32.0–36.0)
MCV: 85.5 fL (ref 80.0–100.0)
MPV: 9.4 fL (ref 7.5–12.5)
Monocytes Relative: 7.2 %
Neutro Abs: 3552 cells/uL (ref 1500–7800)
Neutrophils Relative %: 59.2 %
Platelets: 293 10*3/uL (ref 140–400)
RBC: 5.5 10*6/uL (ref 4.20–5.80)
RDW: 12.1 % (ref 11.0–15.0)
Total Lymphocyte: 28.4 %
WBC: 6 10*3/uL (ref 3.8–10.8)

## 2023-03-06 LAB — TESTOSTERONE: Testosterone: 380 ng/dL (ref 250–827)

## 2023-03-06 LAB — LIPID PANEL
Cholesterol: 173 mg/dL (ref <200)
HDL: 51 mg/dL (ref >=40)
LDL Cholesterol (Calc): 98 mg/dL (calc)
Non-HDL Cholesterol (Calc): 122 mg/dL (calc) (ref <130)
Total CHOL/HDL Ratio: 3.4 (calc) (ref <5.0)
Triglycerides: 139 mg/dL (ref <150)

## 2023-03-06 LAB — HEMOGLOBIN A1C W/OUT EAG: Hgb A1c MFr Bld: 5.5 % of total Hgb (ref <5.7)

## 2023-03-06 LAB — COMPLETE METABOLIC PANEL WITH GFR
AG Ratio: 1.9 (calc) (ref 1.0–2.5)
ALT: 27 U/L (ref 9–46)
AST: 24 U/L (ref 10–35)
Albumin: 4.5 g/dL (ref 3.6–5.1)
Alkaline phosphatase (APISO): 80 U/L (ref 35–144)
BUN: 16 mg/dL (ref 7–25)
CO2: 27 mmol/L (ref 20–32)
Calcium: 9.8 mg/dL (ref 8.6–10.3)
Chloride: 105 mmol/L (ref 98–110)
Creat: 1.23 mg/dL (ref 0.70–1.30)
Globulin: 2.4 g/dL (calc) (ref 1.9–3.7)
Glucose, Bld: 95 mg/dL (ref 65–99)
Potassium: 4.7 mmol/L (ref 3.5–5.3)
Sodium: 140 mmol/L (ref 135–146)
Total Bilirubin: 0.9 mg/dL (ref 0.2–1.2)
Total Protein: 6.9 g/dL (ref 6.1–8.1)
eGFR: 70 mL/min/{1.73_m2} (ref >=60)

## 2023-03-06 LAB — URINALYSIS, ROUTINE W REFLEX MICROSCOPIC
Bilirubin Urine: NEGATIVE
Glucose, UA: NEGATIVE
Hgb urine dipstick: NEGATIVE
Ketones, ur: NEGATIVE
Leukocytes,Ua: NEGATIVE
Nitrite: NEGATIVE
Protein, ur: NEGATIVE
Specific Gravity, Urine: 1.021 (ref 1.001–1.035)
pH: 6 (ref 5.0–8.0)

## 2023-03-06 LAB — VITAMIN D 25 HYDROXY (VIT D DEFICIENCY, FRACTURES): Vit D, 25-Hydroxy: 67 ng/mL (ref 30–100)

## 2023-03-06 LAB — PSA: PSA: 1.13 ng/mL (ref <=4.00)

## 2023-03-06 LAB — MAGNESIUM: Magnesium: 2.1 mg/dL (ref 1.5–2.5)

## 2023-03-06 LAB — TSH: TSH: 2.13 mIU/L (ref 0.40–4.50)

## 2023-03-17 ENCOUNTER — Ambulatory Visit (AMBULATORY_SURGERY_CENTER): Payer: 59 | Admitting: Gastroenterology

## 2023-03-17 ENCOUNTER — Encounter: Payer: Self-pay | Admitting: Gastroenterology

## 2023-03-17 VITALS — BP 121/81 | HR 66 | Temp 98.2°F | Resp 11 | Ht 72.5 in | Wt 266.0 lb

## 2023-03-17 DIAGNOSIS — K639 Disease of intestine, unspecified: Secondary | ICD-10-CM

## 2023-03-17 DIAGNOSIS — K6389 Other specified diseases of intestine: Secondary | ICD-10-CM | POA: Diagnosis not present

## 2023-03-17 DIAGNOSIS — K635 Polyp of colon: Secondary | ICD-10-CM

## 2023-03-17 DIAGNOSIS — Z1211 Encounter for screening for malignant neoplasm of colon: Secondary | ICD-10-CM | POA: Diagnosis present

## 2023-03-17 DIAGNOSIS — D124 Benign neoplasm of descending colon: Secondary | ICD-10-CM

## 2023-03-17 MED ORDER — SODIUM CHLORIDE 0.9 % IV SOLN: 500.0000 mL | Freq: Once | INTRAVENOUS | Status: DC

## 2023-03-17 NOTE — Progress Notes (Signed)
Called to room to assist during endoscopic procedure.  Patient ID and intended procedure confirmed with present staff. Received instructions for my participation in the procedure from the performing physician.  

## 2023-03-17 NOTE — Addendum Note (Signed)
Addended by: Karl Bales B on: 03/17/2023 02:52 PM   Modules accepted: Orders

## 2023-03-17 NOTE — Patient Instructions (Addendum)
-  Resume previous diet. - Continue present medications. - Await pathology results. - Repeat colonoscopy for surveillance based on pathology results. - Return to GI office PRN.   YOU HAD AN ENDOSCOPIC PROCEDURE TODAY AT [facility]:   Refer to the procedure report that was given to you for any specific questions about what was found during the examination.  If the procedure report does not answer your questions, please call your gastroenterologist to clarify.  If you requested that your care partner not be given the details of your procedure findings, then the procedure report has been included in a sealed envelope for you to review at your convenience later.  YOU SHOULD EXPECT: Some feelings of bloating in the abdomen. Passage of more gas than usual.  Walking can help get rid of the air that was put into your GI tract during the procedure and reduce the bloating. If you had a lower endoscopy (such as a colonoscopy or flexible sigmoidoscopy) you may notice spotting of blood in your stool or on the toilet paper. If you underwent a bowel prep for your procedure, you may not have a normal bowel movement for a few days.  Please Note:  You might notice some irritation and congestion in your nose or some drainage.  This is from the oxygen used during your procedure.  There is no need for concern and it should clear up in a day or so.  SYMPTOMS TO REPORT IMMEDIATELY:  Following lower endoscopy (colonoscopy or flexible sigmoidoscopy):  Excessive amounts of blood in the stool  Significant tenderness or worsening of abdominal pains  Swelling of the abdomen that is new, acute  Fever of 100F or higher   For urgent or emergent issues, a gastroenterologist can be reached at any hour by calling [phone]. Do not use MyChart messaging for urgent concerns.    DIET:  We do recommend a small meal at first, but then you may proceed to your regular diet.  Drink plenty of fluids but you  should avoid alcoholic beverages for 24 hours.  ACTIVITY:  You should plan to take it easy for the rest of today and you should NOT DRIVE or use heavy machinery until tomorrow (because of the sedation medicines used during the test).    FOLLOW UP: Our staff will call the number listed on your records the next business day following your procedure.  We will call around 7:15- 8:00 am to check on you and address any questions or concerns that you may have regarding the information given to you following your procedure. If we do not reach you, we will leave a message.     If any biopsies were taken you will be contacted by phone or by letter within the next 1-3 weeks.  Please call us at [phone] if you have not heard about the biopsies in 3 weeks.    SIGNATURES/CONFIDENTIALITY: You and/or your care partner have signed paperwork which will be entered into your electronic medical record.  These signatures attest to the fact that that the information above on your After Visit Summary has been reviewed and is understood.  Full responsibility of the confidentiality of this discharge information lies with you and/or your care-partner.

## 2023-03-17 NOTE — Progress Notes (Signed)
To pacu VSS. Report to [email or web address] 

## 2023-03-17 NOTE — Op Note (Signed)
[facility] Patient Name: Bernard Thompson Procedure Date: 03/17/2023 9:00 AM MRN: [id number] Endoscopist: Doristine Locks , MD, [id number] Age: 52 Referring MD:  Date of Birth: [birth year] Gender: Male Account #: [id number] Procedure:                Colonoscopy Indications:              Screening for colorectal malignant neoplasm, This                            is the patient's first colonoscopy Medicines:                Monitored Anesthesia Care Procedure:                Pre-Anesthesia Assessment:                           - Prior to the procedure, a History and Physical                            was performed, and patient medications and                            allergies were reviewed. The patient's tolerance of                            previous anesthesia was also reviewed. The risks                            and benefits of the procedure and the sedation                            options and risks were discussed with the patient.                            All questions were answered, and informed consent                            was obtained. Prior Anticoagulants: The patient has                            taken no anticoagulant or antiplatelet agents. ASA                            Grade Assessment: II - A patient with mild systemic                            disease. After reviewing the risks and benefits,                            the patient was deemed in satisfactory condition to                            undergo the procedure.  After obtaining informed consent, the colonoscope                            was passed under direct vision. Throughout the                            procedure, the patient's blood pressure, pulse, and                            oxygen saturations were monitored continuously. The                            Olympus scope [id number] was introduced through the                            anus and advanced to the  the terminal ileum. The                            colonoscopy was performed without difficulty. The                            patient tolerated the procedure well. The quality                            of the bowel preparation was good. The terminal                            ileum, ileocecal valve, appendiceal orifice, and                            rectum were photographed. Scope In: 9:12:59 AM Scope Out: 9:31:23 AM Scope Withdrawal Time: 0 hours 14 minutes 34 seconds  Total Procedure Duration: 0 hours 18 minutes 24 seconds  Findings:                 The perianal and digital rectal examinations were                            normal.                           One 10 mm submucosal lesion was found at the                            splenic flexure. The overlying mucosa looked normal                            on white light and narrow band imaging. It appeared                            most consistent with a cyst. +pillow sign with                            closed forceps. Biopsies were taken with a cold  forceps for histology. Estimated blood loss was                            minimal. The lesion appeared deflated after                            biopsies were taken.                           A 3 mm polyp was found in the descending colon. The                            polyp was sessile. The polyp was removed with a                            cold snare. Resection and retrieval were complete.                            Estimated blood loss was minimal.                           The exam was otherwise normal throughout the                            remainder of the colon.                           Non-bleeding internal hemorrhoids were found during                            retroflexion. The hemorrhoids were small.                           The terminal ileum appeared normal. Complications:            No immediate complications. Estimated Blood Loss:      Estimated blood loss was minimal. Impression:               - Nodule at the splenic flexure. Biopsied.                           - One 3 mm polyp in the descending colon, removed                            with a cold snare. Resected and retrieved.                           - Non-bleeding internal hemorrhoids.                           - The examined portion of the ileum was normal. Recommendation:           - Patient has a contact number available for                            emergencies.  The signs and symptoms of potential                            delayed complications were discussed with the                            patient. Return to normal activities tomorrow.                            Written discharge instructions were provided to the                            patient.                           - Resume previous diet.                           - Continue present medications.                           - Await pathology results.                           - Repeat colonoscopy for surveillance based on                            pathology results.                           - Return to GI office PRN. Doristine Locks, MD 03/17/2023 9:38:37 AM

## 2023-03-17 NOTE — Progress Notes (Signed)
GASTROENTEROLOGY PROCEDURE H&P NOTE   Primary Care Physician: Lucky Cowboy, MD    Reason for Procedure:  Colon Cancer screening  Plan:    Colonoscopy  Patient is appropriate for endoscopic procedure(s) in the ambulatory ([facility]) setting.  The nature of the procedure, as well as the risks, benefits, and alternatives were carefully and thoroughly reviewed with the patient. Ample time for discussion and questions allowed. The patient understood, was satisfied, and agreed to proceed.     HPI: Bernard Thompson is a 52 y.o. male who presents for colonoscopy for routine Colon Cancer screening.  No active GI symptoms.  No known family history of colon cancer or related malignancy.  Patient is otherwise without complaints or active issues today.  Past Medical History:  Diagnosis Date   Anxiety    Depression    HLD (hyperlipidemia)    HTN (hypertension)     Past Surgical History:  Procedure Laterality Date   FOOT FRACTURE SURGERY  1985    Prior to Admission medications   Medication Sig Start Date End Date Taking? Authorizing Provider  ALPRAZolam (XANAX) 0.5 MG tablet TAKE 1/2 TO 1 TABLET BY MOUTH ONCE TO TWICE DAILY AS NEEDED FOR PANIC OR ANXIETY ATTACK; LIMIT TO 5 DAYS PER WEEK. 01/14/23   Adela Glimpse, NP  losartan (COZAAR) 50 MG tablet Take  1 tablet  Daily  for BP                                                                                                   /                                        TAKE                                                    BY                                            MOUTH 07/28/22   Lucky Cowboy, MD  meloxicam (MOBIC) 7.5 MG tablet Take 1 tablet (7.5 mg total) by mouth daily. 08/19/22 08/19/23  Raynelle Dick, NP  rosuvastatin (CRESTOR) 5 MG tablet TAKE 1 TABLET BY MOUTH DAILY FOR CHOLESTEROL 01/20/23   Raynelle Dick, NP  traZODone (DESYREL) 50 MG tablet TAKE 1/2 TO 1 TABLET BY MOUTH DAILY FOR SLEEP 02/27/23   Raynelle Dick, NP   venlafaxine XR (EFFEXOR-XR) 75 MG 24 hr capsule TAKE 1 CAPSULE(75 MG) BY MOUTH DAILY 01/20/23   Raynelle Dick, NP  VRAYLAR 1.5 MG capsule TAKE 1 CAPSULE(1.5 MG) BY MOUTH DAILY 12/24/22   Adela Glimpse, NP    Current Outpatient Medications  Medication Sig Dispense Refill  ALPRAZolam (XANAX) 0.5 MG tablet TAKE 1/2 TO 1 TABLET BY MOUTH ONCE TO TWICE DAILY AS NEEDED FOR PANIC OR ANXIETY ATTACK; LIMIT TO 5 DAYS PER WEEK. 30 tablet 0   losartan (COZAAR) 50 MG tablet Take  1 tablet  Daily  for BP                                                                                                   /                                        TAKE                                                    BY                                            MOUTH 90 tablet 3   meloxicam (MOBIC) 7.5 MG tablet Take 1 tablet (7.5 mg total) by mouth daily. 30 tablet 2   rosuvastatin (CRESTOR) 5 MG tablet TAKE 1 TABLET BY MOUTH DAILY FOR CHOLESTEROL 90 tablet 1   traZODone (DESYREL) 50 MG tablet TAKE 1/2 TO 1 TABLET BY MOUTH DAILY FOR SLEEP 90 tablet 2   venlafaxine XR (EFFEXOR-XR) 75 MG 24 hr capsule TAKE 1 CAPSULE(75 MG) BY MOUTH DAILY 30 capsule 2   VRAYLAR 1.5 MG capsule TAKE 1 CAPSULE(1.5 MG) BY MOUTH DAILY 30 capsule 3   No current facility-administered medications for this visit.    Allergies as of 03/17/2023   (No Known Allergies)    Family History  Problem Relation Age of Onset   Diabetes Mother    Heart disease Mother    Hypertension Mother    Cancer Maternal Grandmother    Diabetes Maternal Grandfather    Breast cancer Neg Hx    Colon polyps Neg Hx    Colon cancer Neg Hx    Esophageal cancer Neg Hx    Stomach cancer Neg Hx    Rectal cancer Neg Hx     Social History   Socioeconomic History   Marital status: Married    Spouse name: Not on file   Number of children: 1   Years of education: Not on file   Highest education level: Not on file  Occupational History   Occupation: Truck driver   Tobacco Use   Smoking status: Former    Current packs/day: 0.00    Types: Cigarettes    Quit date: 06/10/1990    Years since quitting: 32.7   Smokeless tobacco: Never  Vaping Use   Vaping status: Never Used  Substance and Sexual Activity   Alcohol use: No   Drug use: No   Sexual activity: Not  on file  Other Topics Concern   Not on file  Social History Narrative   Not on file   Social Determinants of Health   Financial Resource Strain: Not on file  Food Insecurity: Not on file  Transportation Needs: Not on file  Physical Activity: Not on file  Stress: Not on file  Social Connections: Not on file  Intimate Partner Violence: Not on file    Physical Exam: Vital signs in last 24 hours: @There  were no vitals taken for this visit. GEN: NAD EYE: Sclerae anicteric ENT: MMM CV: Non-tachycardic Pulm: CTA b/l GI: Soft, NT/ND NEURO:  Alert & Oriented x 3   Doristine Locks, DO [facility]   03/17/2023 8:33 AM

## 2023-03-18 ENCOUNTER — Telehealth: Payer: Self-pay | Admitting: *Deleted

## 2023-03-18 NOTE — Telephone Encounter (Signed)
  Follow up Call-     03/17/2023    8:55 AM 03/17/2023    8:47 AM  Call back number  Post procedure Call Back phone  # [phone]   Permission to leave phone message  Yes    Post procedure follow up phone call. No answer at number given.  Left message on voicemail.

## 2023-03-19 LAB — SURGICAL PATHOLOGY

## 2023-03-31 ENCOUNTER — Other Ambulatory Visit: Payer: Self-pay | Admitting: Nurse Practitioner

## 2023-03-31 DIAGNOSIS — F419 Anxiety disorder, unspecified: Secondary | ICD-10-CM

## 2023-04-19 ENCOUNTER — Other Ambulatory Visit: Payer: Self-pay | Admitting: Nurse Practitioner

## 2023-04-19 DIAGNOSIS — F3341 Major depressive disorder, recurrent, in partial remission: Secondary | ICD-10-CM

## 2023-04-25 ENCOUNTER — Other Ambulatory Visit: Payer: Self-pay | Admitting: Nurse Practitioner

## 2023-04-25 DIAGNOSIS — F3341 Major depressive disorder, recurrent, in partial remission: Secondary | ICD-10-CM

## 2023-06-15 IMAGING — MR MR ELBOW*R* W/O CM
4 of 5 series · 13 of 40 positions shown · non-contrast
Comparison: None.

CLINICAL DATA: Persistent right elbow pain, xray shows no fracture

EXAM:
MRI OF THE RIGHT ELBOW WITHOUT CONTRAST
TECHNIQUE: Multiplanar, multisequence MR imaging of the elbow was performed. No
intravenous contrast was administered.

[Series 4: T1 · axial · right · 3.0mm · 0.16mm/px · z∈[+14,+83]mm · 3 of 27 slices shown]
[im 4/27]
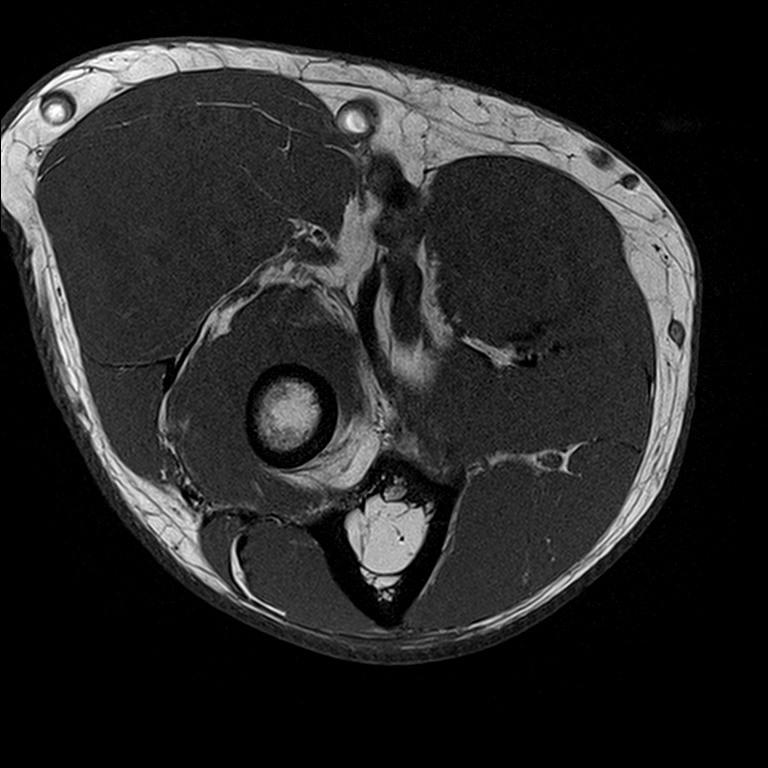
[im 14/27]
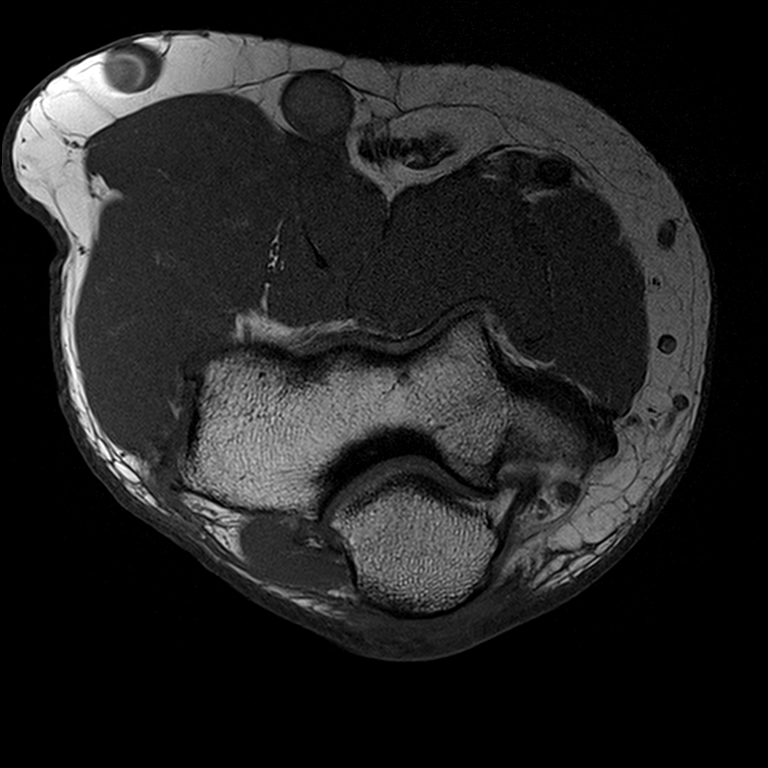
[im 23/27]
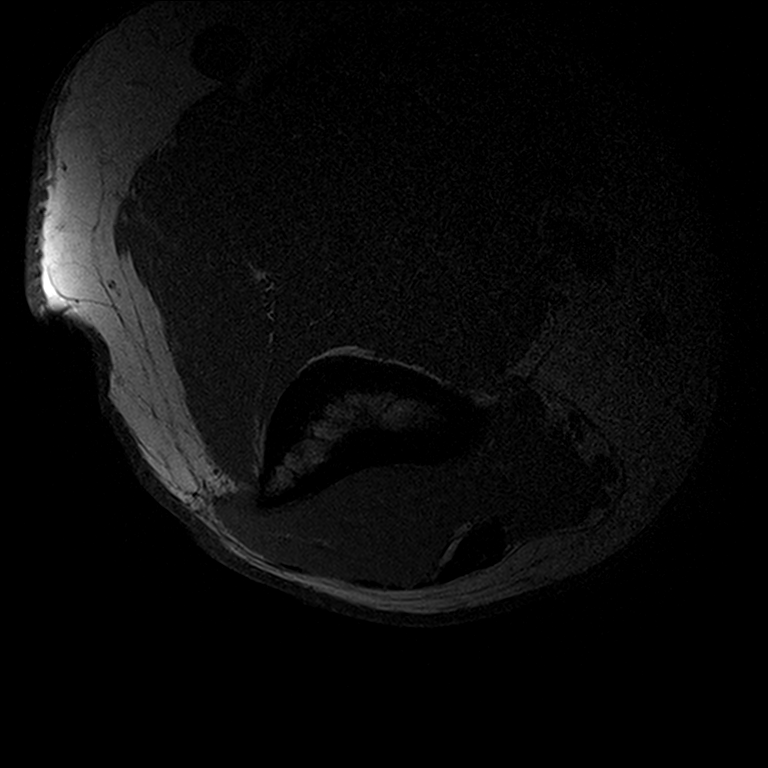

[Series 5: T2 fat-sat · axial · right · 3.0mm · 0.19mm/px · z∈[+6,+86]mm · 4 of 27 slices shown (1 of 3)]
[im 1/27]
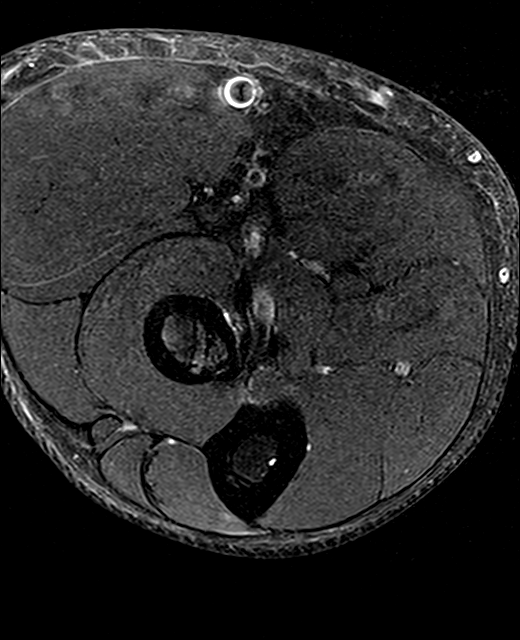
[im 4/27]
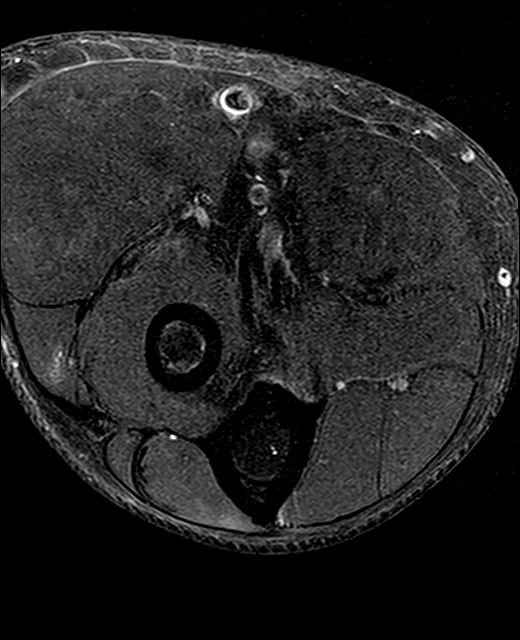
[im 14/27]
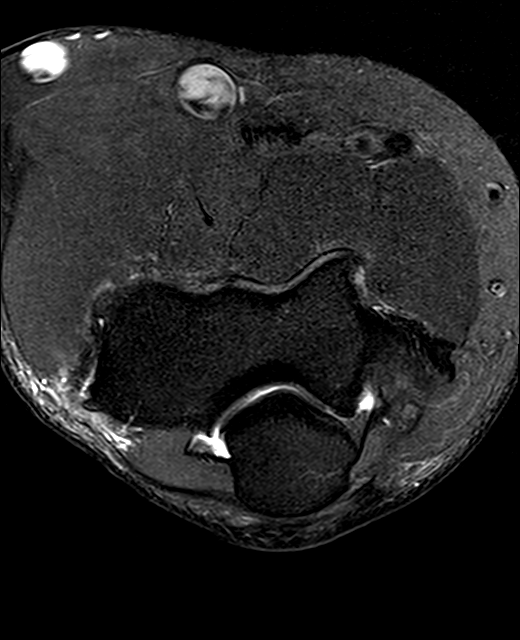
[im 23/27]
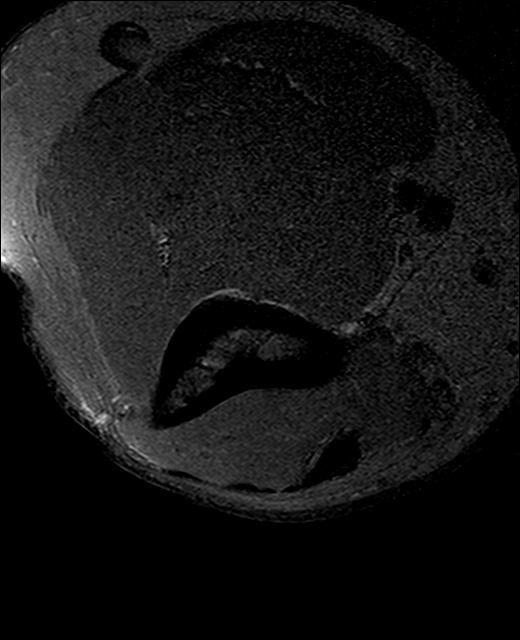

[Series 6: T2 fat-sat · coronal · right · 3.0mm · 0.22mm/px · 3 of 23 slices shown (2 of 3)]
[im 4/23]
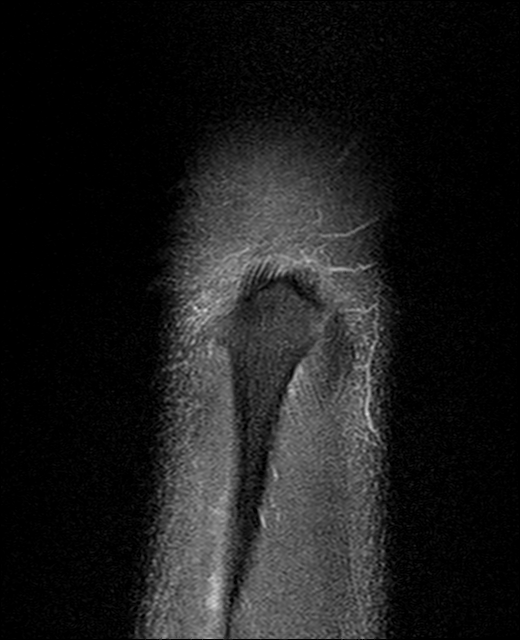
[im 12/23]
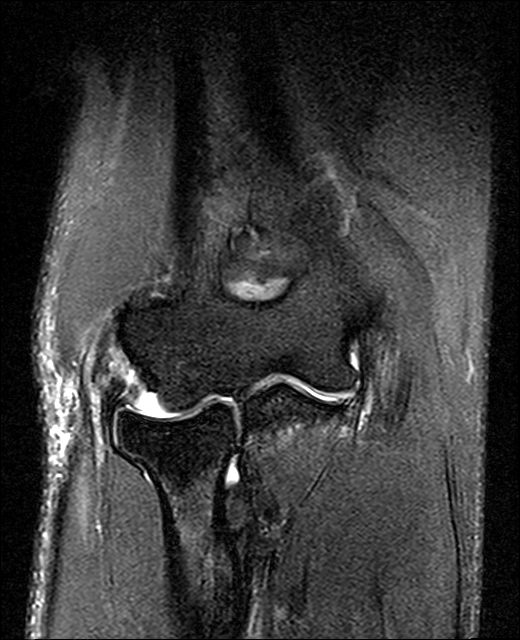
[im 19/23]
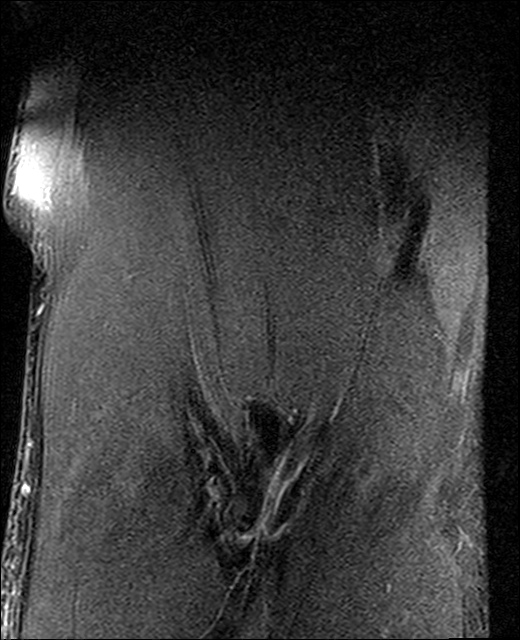

[Series 8: T2 fat-sat · sagittal · right · 3.0mm · 0.22mm/px · 3 of 25 slices shown (3 of 3)]
[im 4/25]
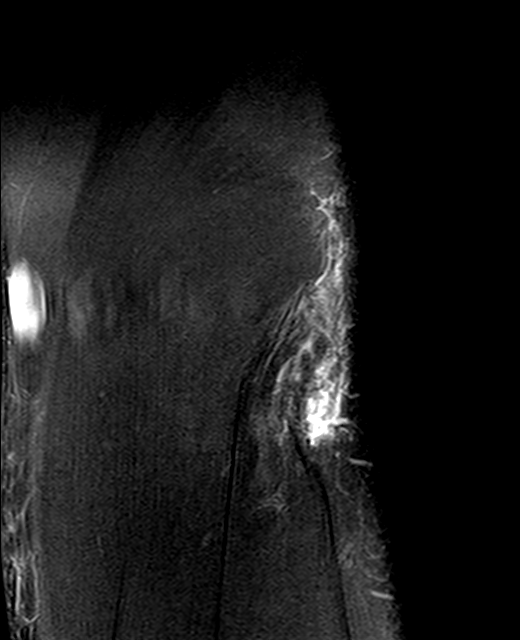
[im 14/25]
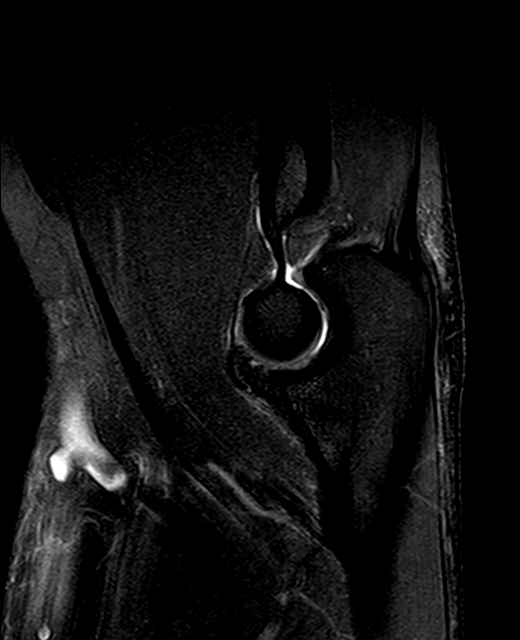
[im 21/25]
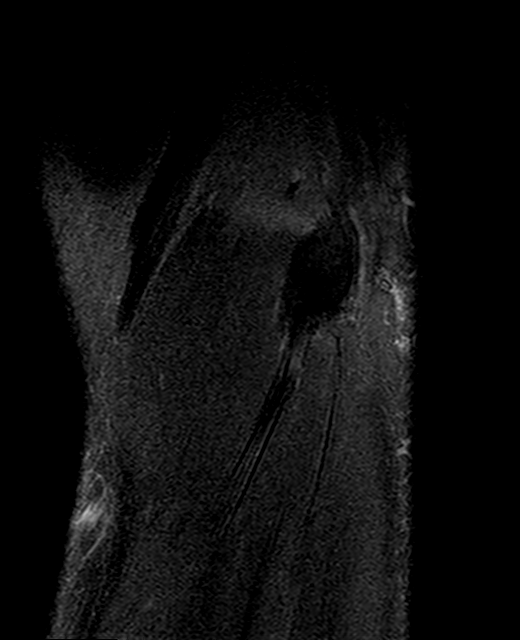

[13 of 40 positions shown; findings below may reference images not displayed]

FINDINGS: TENDONS

Common forearm flexor origin: Intact

Common forearm extensor origin: There is tendinosis and intermediate
grade partial tearing of the deep aspect of the common extensor
tendon at its insertion on the lateral epicondyle.

Biceps: Intact

Triceps: Intact

LIGAMENTS

Medial stabilizers: Intact ulnar collateral ligament.

Lateral stabilizers: Increased signal at the proximal attachment of
the radial collateral ligament. Intact lateral ulnar collateral
ligament.

Cartilage: No chondral defect.

Joint: No joint effusion. No synovitis.

Cubital tunnel: Normal.

Bones: No fracture or dislocation. No marrow abnormality.

Soft Tissues: Muscles are normal. No fluid collection or hematoma.
IMPRESSION: Tendinosis with intermediate grade partial tearing of the deep
aspect of the common extensor tendon and low-grade partial tearing
of the radial collateral ligament near their insertions on the
lateral epicondyle. Intact lateral ulnar collateral ligament.

## 2023-07-17 ENCOUNTER — Encounter: Payer: Self-pay | Admitting: Nurse Practitioner

## 2023-07-21 ENCOUNTER — Ambulatory Visit (HOSPITAL_COMMUNITY)
Admission: EM | Admit: 2023-07-21 | Discharge: 2023-07-21 | Disposition: A | Discharge status: Home / Self Care | Payer: 59

## 2023-07-21 DIAGNOSIS — R4589 Other symptoms and signs involving emotional state: Secondary | ICD-10-CM | POA: Diagnosis not present

## 2023-07-21 DIAGNOSIS — Z760 Encounter for issue of repeat prescription: Secondary | ICD-10-CM | POA: Diagnosis not present

## 2023-07-21 DIAGNOSIS — F339 Major depressive disorder, recurrent, unspecified: Secondary | ICD-10-CM

## 2023-07-21 NOTE — Progress Notes (Signed)
   07/21/23 1128  [facility] Triage Screening (Walk-ins at [facility] only)  What Is the Reason for Your Visit/Call Today? Bernard Thompson is a 53 year old male presenting to [name] accompanied by his wife. Pt reports he is looking to speak with a therapist about his depression. Pt reports that his depression is getting worse and has been ongoing for several years. Pt is currently taking Vraylar  for his depression. Pt mentions he is just looking to establish therapy services at this time. Pt also reports he does not have a doc any longer that can prescribe him medication. Pt denies substance use, Si, Hi and Avh.  How Long Has This Been Causing You Problems? > than 6 months  Have You Recently Had Any Thoughts About Hurting Yourself? No  Are You Planning to Commit Suicide/Harm Yourself At This time? No  Have you Recently Had Thoughts About Hurting Someone Marigene Shoulder? No  Are You Planning To Harm Someone At This Time? No  Physical Abuse Denies  Verbal Abuse Denies  Sexual Abuse Denies  Exploitation of patient/patient's resources Denies  Self-Neglect Denies  Possible abuse reported to: Other (Comment)  Are you currently experiencing any auditory, visual or other hallucinations? No  Have You Used Any Alcohol or Drugs in the Past 24 Hours? No  Do you have any current medical co-morbidities that require immediate attention? No  Clinician description of patient physical appearance/behavior: calm, cooperative  What Do You Feel Would Help You the Most Today? Medication(s)  If access to [facility] was not available, would you have sought care in the Emergency Department? No  Determination of Need Routine (7 days)  Options For Referral Medication Management

## 2023-07-21 NOTE — Discharge Instructions (Signed)
   Outpatient Services for Therapy and Medication Management for Medicaid  Based on what you have shared, a list of resources for outpatient therapy and psychiatry is provided below to get you started back on treatment.  It is imperative that you follow through with treatment within 5-7 days from the day of discharge to prevent any further risk to your safety or mental well-being.  You are not limited to the list provided.  In case of an urgent crisis, you may contact the Mobile Crisis Unit with [facility] at [phone].         [facility] [address], [place], [address] [phone] phone  New Patient Assessment/Therapy Walk-ins Monday and Wednesday: 8am until slots are full. Every 1st and 2nd Friday: 1pm - 5pm  NO ASSESSMENT/THERAPY WALK-INS ON TUESDAYS OR THURSDAYS  New Patient Psychiatry/Medication Management Walk-ins Monday-Friday: 8am-11am  For all walk-ins, we ask that you arrive by 7:30am because patient will be seen in the order of arrival.  Availability is limited; therefore, you may not be seen on the same day that you walk-in.  Our goal is to serve and meet the needs of our community to the best of our ability.   [facility] [address] [place], [place], [address] [phone] phone  [facility] [address] [place], [place], [address] [phone] phone [phone] phone ([address], [address], Anthem/Elevance, [address], [place], [address], [address], Healthy [place], [place], [place], [address], ConocoPhillips, [place], UHC, American Financial, Drexel, Out of Network)  [facility], [place] [address] [place], [place], [address] [phone] phone ([place], Anthem/Elevance, Sanmina-SCI Options/Carelon, BCBS, [address], [place], [place], [place], [place], Har[employer], [place], Stryker, [place], [facility])  [facility] [address] [place], [place], [address] [phone] phone (Medicaid, ask about other insurance)  [facility] [address] [place], [place], [address] [phone] phone [phone] fax ([address], [place] , [place], [place], [place] Health Choice, UHC, General Electric, Self-Pay)  Sarah Lempka [address] [place], [place], [address] [phone] phone ([address], Anthem/Elevance, [address], [address], [place], CSX Corporation, [place], [place], [place], Har[employer] Entertainment, [place], [place], [place], [facility])  [facility] - 6-8 MONTH WAIT FOR THERAPY; [facility]ICATION MANAGEMENT [address] [place] [address], [address] [place], [place], [address] [phone] phone ([address], AmeriHealth [address] [place], [address], [place], [place], Friday Health Plans, [address], BCBS Healthy [place], [place], [address], [place], [place], [place], [place], Tricare, UHC, Safeco Corporation, [place])  [facility] [address] [place], [place], [address] [phone] phone  [facility] [address], [address] [place], [place], [address] [phone] phone  [facility] [address] [place], [place], [address] [phone] phone  [facility] - THERAPY ONLY [address]  [place], [place], [address] [phone] phone  [facility], [place] [address], [place], [address] [phone] phone  [facility] [address] [place], [place], [address] [phone] phone [phone] fax  [facility] [address] [place], [place], [address] [phone] phone [phone] fax  [facility] [address] [place], [place], [address] [phone] phone  Arnold Bicker [address] [place], [place], [address]  [phone] phone

## 2023-07-21 NOTE — ED Notes (Signed)
 Patient A&O x 4, ambulatory. Patient discharged in no acute distress. Patient denied SI/HI, A/VH upon discharge. Patient verbalized understanding of all discharge instructions reviewed on AVS via staff, to include follow up appointments, and safety. Suicide safety plan completed and reviewed with Clinical research associate. A copy given to pt. Patient reported mood 10/10.  Pt belongings returned to patient from black locker intact. Patient escorted to lobby via staff for transport to destination. Safety maintained.

## 2023-07-21 NOTE — ED Provider Notes (Signed)
Behavioral Health Urgent Care Medical Screening Exam  Patient Name: Bernard Thompson MRN: [id number] Date of Evaluation: 07/22/23 Chief Complaint:  increase depression  Diagnosis:  Final diagnoses:  Recurrent major depressive disorder, remission status unspecified (HCC)  Encounter for medication refill  Anxious appearance    History of Present illness: Bernard Thompson is a 53 y.o. male.  With a history of major depressive disorder recurrent.  Presented to [facility] volunteer accompanied by his wife, per the patient he cannot seem to shake it referring to his depression.  Patient stated he has been diagnosed with depression for years have been taking Vraylar and Effexor.  According to patient he is currently not seeing a psychiatrist or therapist but his PCP right seeing his medication.  According to the patient his provider passed away a couple weeks ago and he is currently looking to source of new provider and get his medications refill.  According to patient currently only have a week supply of medicine.  Patient denies any prior hospitalization.  According to patient he lives with his wife and his stepson.  Patient does not show any sign of distress at this time.  Face-to-face observation of patient, patient is alert and oriented x 4, speech is clear, maintain eye contact.  Patient does appear to be a little bit anxious however pleasant overall.  Patient answers questions appropriately.  Patient denies SI, HI, AVH or paranoia.  Denies wanting to hurt himself or others.  Denies smoking, denies illicit drug use, denies drinking.  This time patient does not seem to be influenced by internal or external stimuli.  Patient does report he owns a gun but has no thoughts of wanting to hurt himself.  At this time patient stated he only needs to get some resources so he can get in to see a therapist and also get to also see his a psychiatrist so he can continue his medications.  Writer discussed with patient the  walk-in psychiatry clinic that we have gave him the times to the clinic.  Patient was also given resources by social work's for other clinics and therapy.  At the time of this evaluation patient does not seem to pose a risk to himself or others does not show any sign of distress very corporative.  Patient is advised to call 911 or return to the nearest ED should he experience any suicidal ideation homicidal thoughts or hallucination.  Patient verbalized understanding.  Recommend discharge for patient to follow-up with outpatient resources and or walk-in psychiatry.  Flowsheet Row ED from 07/21/2023 in [facility]  C-SSRS RISK CATEGORY No Risk       Psychiatric Specialty Exam  Presentation  General Appearance:No data recorded Eye Contact:Good  Speech:Clear and Coherent  Speech Volume:Normal  Handedness:Right   Mood and Affect  Mood: Depressed  Affect: Appropriate   Thought Process  Thought Processes: Coherent  Descriptions of Associations:Intact  Orientation:Full (Time, Place and Person)  Thought Content:Logical    Hallucinations:None  Ideas of Reference:None  Suicidal Thoughts:No  Homicidal Thoughts:No   Sensorium  Memory: Immediate Good  Judgment: Good  Insight: Good   Executive Functions  Concentration: Good  Attention Span: Good  Recall: Good  Fund of Knowledge: Good  Language: Good   Psychomotor Activity  Psychomotor Activity: Normal   Assets  Assets: Communication Skills   Sleep  Sleep: Fair  Number of hours:  6   Physical Exam: Physical Exam HENT:     Head: Normocephalic.  Nose: Nose normal.  Eyes:     Pupils: Pupils are equal, round, and reactive to light.  Cardiovascular:     Rate and Rhythm: Normal rate.  Pulmonary:     Effort: Pulmonary effort is normal.  Musculoskeletal:        General: Normal range of motion.     Cervical back: Normal range of motion.   Neurological:     General: No focal deficit present.     Mental Status: He is alert.  Psychiatric:        Mood and Affect: Mood normal.    Review of Systems  Constitutional: Negative.   HENT: Negative.    Eyes: Negative.   Respiratory: Negative.    Cardiovascular: Negative.   Gastrointestinal: Negative.   Genitourinary: Negative.   Musculoskeletal: Negative.   Skin: Negative.   Neurological: Negative.   Psychiatric/Behavioral:  Positive for depression. The patient is nervous/anxious.    Blood pressure (!) 155/114, pulse (!) 106, resp. rate 20, SpO2 100%. There is no height or weight on file to calculate BMI.  Musculoskeletal: Strength & Muscle Tone: within normal limits Gait & Station: normal Patient leans: N/A   [facility] MSE Discharge Disposition for Follow up and Recommendations: Based on my evaluation the patient does not appear to have an emergency medical condition and can be discharged with resources and follow up care in outpatient services for Medication Management and Individual Therapy   Sindy Guadeloupe, NP 07/22/2023, 3:37 AM

## 2023-07-22 ENCOUNTER — Other Ambulatory Visit: Payer: Self-pay

## 2023-07-22 DIAGNOSIS — I10 Essential (primary) hypertension: Secondary | ICD-10-CM

## 2023-07-22 MED ORDER — LOSARTAN POTASSIUM 50 MG PO TABS: ORAL_TABLET | ORAL | 0 refills | Status: DC

## 2023-07-23 ENCOUNTER — Other Ambulatory Visit: Payer: Self-pay

## 2023-07-23 DIAGNOSIS — F419 Anxiety disorder, unspecified: Secondary | ICD-10-CM

## 2023-07-23 DIAGNOSIS — F3341 Major depressive disorder, recurrent, in partial remission: Secondary | ICD-10-CM

## 2023-07-23 DIAGNOSIS — E785 Hyperlipidemia, unspecified: Secondary | ICD-10-CM

## 2023-07-23 MED ORDER — ROSUVASTATIN CALCIUM 5 MG PO TABS: ORAL_TABLET | ORAL | 0 refills | Status: DC

## 2023-07-23 MED ORDER — VENLAFAXINE HCL ER 75 MG PO CP24
ORAL_CAPSULE | ORAL | 2 refills | Status: DC
Start: 2023-07-23 — End: 2023-09-01

## 2023-07-23 MED ORDER — ALPRAZOLAM 0.5 MG PO TABS: ORAL_TABLET | ORAL | 0 refills | Status: AC

## 2023-08-25 ENCOUNTER — Ambulatory Visit: Payer: 59 | Admitting: Nurse Practitioner

## 2023-08-26 ENCOUNTER — Other Ambulatory Visit: Payer: Self-pay | Admitting: Family

## 2023-08-26 ENCOUNTER — Other Ambulatory Visit: Payer: Self-pay

## 2023-08-26 DIAGNOSIS — F3341 Major depressive disorder, recurrent, in partial remission: Secondary | ICD-10-CM

## 2023-08-26 MED ORDER — CARIPRAZINE HCL 1.5 MG PO CAPS: 1.5000 mg | ORAL_CAPSULE | Freq: Every day | ORAL | 3 refills | Status: DC

## 2023-08-29 DIAGNOSIS — L409 Psoriasis, unspecified: Secondary | ICD-10-CM | POA: Insufficient documentation

## 2023-08-29 DIAGNOSIS — L309 Dermatitis, unspecified: Secondary | ICD-10-CM | POA: Insufficient documentation

## 2023-09-01 ENCOUNTER — Encounter: Payer: Self-pay | Admitting: General Practice

## 2023-09-01 ENCOUNTER — Ambulatory Visit: Payer: Managed Care, Other (non HMO) | Admitting: General Practice

## 2023-09-01 VITALS — BP 130/80 | HR 82 | Temp 97.0°F | Ht 73.0 in | Wt 297.0 lb

## 2023-09-01 DIAGNOSIS — I10 Essential (primary) hypertension: Secondary | ICD-10-CM

## 2023-09-01 DIAGNOSIS — G4700 Insomnia, unspecified: Secondary | ICD-10-CM

## 2023-09-01 DIAGNOSIS — E785 Hyperlipidemia, unspecified: Secondary | ICD-10-CM | POA: Diagnosis not present

## 2023-09-01 DIAGNOSIS — F331 Major depressive disorder, recurrent, moderate: Secondary | ICD-10-CM | POA: Diagnosis not present

## 2023-09-01 DIAGNOSIS — Z7689 Persons encountering health services in other specified circumstances: Secondary | ICD-10-CM

## 2023-09-01 DIAGNOSIS — R918 Other nonspecific abnormal finding of lung field: Secondary | ICD-10-CM

## 2023-09-01 DIAGNOSIS — Z23 Encounter for immunization: Secondary | ICD-10-CM

## 2023-09-01 NOTE — Assessment & Plan Note (Signed)
 Controlled.  Reviewed Lipid panel from September, 2024.   Continue Crestor 5 mg once daily.

## 2023-09-01 NOTE — Patient Instructions (Addendum)
 Schedule physical for end of September, 2025  Please reach out incase you need anything sooner.   It was a pleasure meeting you!

## 2023-09-01 NOTE — Assessment & Plan Note (Signed)
 Controlled.   Continue Losartan 50 mg once daily.

## 2023-09-01 NOTE — Assessment & Plan Note (Addendum)
 Controlled.  Followed by psychiatrist.   Continue Prozac 20 mg and Vraylar 3 mg once daily.   Continue Xanax as needed. Using it sparingly.

## 2023-09-01 NOTE — Assessment & Plan Note (Signed)
 Reviewed CT scan results from October 2023.   He will need a repeat CT in October per recommendations.

## 2023-09-01 NOTE — Progress Notes (Signed)
 New Patient Office Visit  Subjective    Patient ID: Bernard Thompson, male    DOB: [birth year]  Age: 53 y.o. MRN: [id number]  CC:  Chief Complaint  Patient presents with   New Patient (Initial Visit)    Wants to start shingles vaccine today    HPI Bernard Thompson is a 53 y.o. male presents to establish care.  Last PCP: Bernard Thompson.  Last physical: September, 2024.   Immunizations: -Tetanus: Completed in 2019 -Influenza: 2024 -Shingles: due  Colonoscopy: Completed in 2024  HTN: currently managed on Losartan 50 mg once daily. Does not checks BP at home. Tolerating well. No blurred vision, headaches, chest pain or shortness of breath.   HLD: currently managed on rosuvastatin 5 mg once daily. family history of hyperlipidemia with mother. Tolerating it well. No concerns today.  Depression and Insomnia: Currently managed on Prozac 20 mg once daily and Vraylar 3 mg once daily for depression. Trazodone 25-50 mg once daily. He also uses Xanax 0.5 mg (1/2 to 1 tablet) as needed for anxiety and panic attacks, limiting to 5 a week. He reports that he only has to take xanax on weekends and only takes about 1 or 2 a week. Usually his [prescription last him about 3 months. He sees a psychiatrist, Bernard Thompson, who helps with managed on depression. He denies SI/HI.   Pulmonary Nodules: former smoker (smoked as a teenager), CT in 02/2020 showed pulmonary nodules. Last Chest CT was on 03/2022 which showed stable tiny pulmonary nodules, largest is a 6 mm ground-glass in left lower lobe nodules; calcification in the right coronary artery and hepatic steatosis. Does not see pulmonary. Denies any chest pain, shortness of breath or difficulty breathing.   Outpatient Encounter Medications as of 09/01/2023  Medication Sig   ALPRAZolam (XANAX) 0.5 MG tablet TAKE 1/2 TO 1 TABLET BY MOUTH ONCE DAILY AS NEEDED FOR PANIC OR ANXIETY ATTACK; LIMIT TO 5 DAYS PER WEEK.   FLUoxetine (PROZAC) 20 MG capsule Take 20 mg by  mouth every morning.   losartan (COZAAR) 50 MG tablet Take  1 tablet  Daily  for BP                                                                                                   /                                        TAKE                                                    BY  MOUTH   rosuvastatin (CRESTOR) 5 MG tablet TAKE 1 TABLET BY MOUTH DAILY FOR CHOLESTEROL   traZODone (DESYREL) 50 MG tablet TAKE 1/2 TO 1 TABLET BY MOUTH DAILY FOR SLEEP   VRAYLAR 3 MG capsule Take 3 mg by mouth daily.   [DISCONTINUED] cariprazine (VRAYLAR) 1.5 MG capsule Take 1 capsule (1.5 mg total) by mouth daily. (Patient not taking: Reported on 09/01/2023)   [DISCONTINUED] venlafaxine XR (EFFEXOR-XR) 75 MG 24 hr capsule TAKE 1 CAPSULE(75 MG) BY MOUTH DAILY   No facility-administered encounter medications on file as of 09/01/2023.    Past Medical History:  Diagnosis Date   Anxiety    Depression    HLD (hyperlipidemia)    HTN (hypertension)     Past Surgical History:  Procedure Laterality Date   FOOT FRACTURE SURGERY  1985    Family History  Problem Relation Age of Onset   Diabetes Mother    Heart disease Mother    Hypertension Mother    Cancer Father    Cancer Maternal Grandmother    Diabetes Maternal Grandfather    Breast cancer Neg Hx    Colon polyps Neg Hx    Colon cancer Neg Hx    Esophageal cancer Neg Hx    Stomach cancer Neg Hx    Rectal cancer Neg Hx     Social History   Socioeconomic History   Marital status: Married    Spouse name: Bernard Thompson   Number of children: 1   Years of education: Not on file   Highest education level: 12th grade  Occupational History   Occupation: Bernard Thompson  Tobacco Use   Smoking status: Former    Current packs/day: 0.00    Types: Cigarettes    Quit date: 06/10/1990    Years since quitting: 33.2   Smokeless tobacco: Never  Vaping Use   Vaping status: Never Used  Substance and Sexual Activity   Alcohol  use: No   Drug use: No   Sexual activity: Not Currently  Other Topics Concern   Not on file  Social History Narrative   Not on file   Social Drivers of Health   Financial Resource Strain: Medium Risk (08/31/2023)   Overall Financial Resource Strain (CARDIA)    Difficulty of Paying Living Expenses: Somewhat hard  Food Insecurity: No Food Insecurity (08/31/2023)   Hunger Vital Sign    Worried About Running Out of Food in the Last Year: Never true    Ran Out of Food in the Last Year: Never true  Transportation Needs: No Transportation Needs (08/31/2023)   PRAPARE - Administrator, Civil Service (Medical): No    Lack of Transportation (Non-Medical): No  Physical Activity: Unknown (08/31/2023)   Exercise Vital Sign    Days of Exercise per Week: 0 days    Minutes of Exercise per Session: Not on file  Stress: Stress Concern Present (08/31/2023)   Bernard Thompson of Occupational Health - Occupational Stress Questionnaire    Feeling of Stress : Very much  Social Connections: Socially Isolated (08/31/2023)   Social Connection and Isolation Panel [NHANES]    Frequency of Communication with Friends and Family: Once a week    Frequency of Social Gatherings with Friends and Family: Never    Attends Religious Services: Never    Database administrator or Organizations: No    Attends Engineer, structural: Not on file    Marital Status: Married  Catering manager Violence: Not on file  Review of Systems  Constitutional:  Negative for chills and fever.  Respiratory:  Negative for shortness of breath.   Cardiovascular:  Negative for chest pain.  Gastrointestinal:  Negative for abdominal pain, constipation, diarrhea, heartburn, nausea and vomiting.  Genitourinary:  Negative for dysuria, frequency and urgency.  Neurological:  Negative for dizziness and headaches.  Endo/Heme/Allergies:  Negative for polydipsia.  Psychiatric/Behavioral:  Negative for depression and suicidal  ideas. The patient is not nervous/anxious.         Objective    BP 130/80 (BP Location: Left Arm, Patient Position: Sitting, Cuff Size: Large)   Pulse 82   Temp (!) 97 F (36.1 C) (Oral)   Ht 6\' 1"  (1.854 m)   Wt 297 lb (134.7 kg)   SpO2 96%   BMI 39.18 kg/m   Physical Exam Vitals and nursing note reviewed.  Constitutional:      Appearance: Normal appearance.  Cardiovascular:     Rate and Rhythm: Normal rate and regular rhythm.     Pulses: Normal pulses.     Heart sounds: Normal heart sounds.  Pulmonary:     Effort: Pulmonary effort is normal.     Breath sounds: Normal breath sounds.  Musculoskeletal:        General: No swelling.  Neurological:     Mental Status: He is alert and oriented to person, place, and time.  Psychiatric:        Mood and Affect: Mood normal.        Behavior: Behavior normal.        Thought Content: Thought content normal.        Judgment: Judgment normal.         Assessment & Plan:  Depression, major, recurrent, moderate (HCC) Assessment & Plan: Controlled.  Followed by psychiatrist.   Continue Prozac 20 mg and Vraylar 3 mg once daily.   Continue Xanax as needed. Using it sparingly.    Establishing care with new Thompson, encounter for  Need for shingles vaccine -     Varicella-zoster vaccine IM  Essential hypertension Assessment & Plan: Controlled.   Continue Losartan 50 mg once daily.   Hyperlipidemia, unspecified hyperlipidemia type Assessment & Plan: Controlled.  Reviewed Lipid panel from September, 2024.   Continue Crestor 5 mg once daily.    Insomnia, unspecified type Assessment & Plan: Controlled.   Continue Trazodone 25-50 mg once daily.    Pulmonary nodules Assessment & Plan: Reviewed CT scan results from October 2023.   He will need a repeat CT in October per recommendations.      Return in about 6 months (around 03/05/2024) for physical.   [place]esto Charon, NP

## 2023-09-01 NOTE — Assessment & Plan Note (Signed)
 Controlled.   Continue Trazodone 25-50 mg once daily.

## 2023-10-29 ENCOUNTER — Other Ambulatory Visit: Payer: Self-pay | Admitting: Family

## 2023-10-29 DIAGNOSIS — E785 Hyperlipidemia, unspecified: Secondary | ICD-10-CM

## 2023-10-30 ENCOUNTER — Other Ambulatory Visit: Payer: Self-pay | Admitting: General Practice

## 2023-10-30 DIAGNOSIS — E785 Hyperlipidemia, unspecified: Secondary | ICD-10-CM

## 2023-10-30 NOTE — Telephone Encounter (Signed)
 Erx sent. Patient needs CPE scheduled for September. Please call patient to schedule.

## 2023-10-31 NOTE — Telephone Encounter (Signed)
 Lvmtcb, sent mychart message

## 2023-11-04 NOTE — Telephone Encounter (Signed)
 lvm for pt to call office to schedule appt.

## 2023-11-05 NOTE — Telephone Encounter (Signed)
lvm for pt to call office to reschedule appt

## 2023-11-05 NOTE — Telephone Encounter (Signed)
 Pt scheduled cpe for 03/05/24

## 2024-02-10 ENCOUNTER — Ambulatory Visit: Admitting: General Practice

## 2024-02-10 ENCOUNTER — Encounter: Payer: Self-pay | Admitting: General Practice

## 2024-02-10 VITALS — BP 124/80 | HR 96 | Temp 97.3°F | Ht 73.0 in | Wt 293.0 lb

## 2024-02-10 DIAGNOSIS — Z23 Encounter for immunization: Secondary | ICD-10-CM

## 2024-02-10 DIAGNOSIS — M549 Dorsalgia, unspecified: Secondary | ICD-10-CM

## 2024-02-10 MED ORDER — PREDNISONE 10 MG (21) PO TBPK: ORAL_TABLET | ORAL | 0 refills | Status: DC

## 2024-02-10 NOTE — Patient Instructions (Addendum)
 Start prednisone  pack and follow instructions on the package.  Start Voltaren gel up to 4 times a day. This is available over the counter.  Recommend tylenol 500 mg and ibuprofen 600 mg together for pain every 6-8 hours.  Heat to site, lidocaine patches as needed. These are available over the counter. Exercises to area as tolerated.  Follow up in 2  weeks.   It was a pleasure to see you today!

## 2024-02-10 NOTE — Progress Notes (Signed)
 Established Patient Office Visit  Subjective   Patient ID: Bernard Thompson, male    DOB: 1970-08-07  Age: 53 y.o. MRN: [id number]  Chief Complaint  Patient presents with   Back Pain    Patient has right side back pain; no urinary sx involved. Patient states soreness started about 3 months ago and has intensified in the last few days. Patient has not taken anything for sx yet.     Back Pain Pertinent negatives include no abdominal pain, chest pain, dysuria, fever or headaches.   Bernard Thompson is a 53 year old male with past medical history of HTN, pulmonary nodules, eczema, prediabetes, HLD, psoriasis, depression, insomnia presents today for an acute visit.   Discussed the use of AI scribe software for clinical note transcription with the patient, who gave verbal consent to proceed.  History of Present Illness Bernard Thompson is a 53 year old male who presents with worsening back pain.  He has been experiencing back pain for several months, with a recent exacerbation over the past few days. The pain is primarily located on the right side of his mid-back and is described as pulsating and throbbing, particularly intensifying during sleep. The pain affects his daily activities, making it sore when bending down to pick up objects. No radiation of the pain down his leg, and it does not cause urinary or stool incontinence. He has not tried any medications for the back pain and has not used ice or heat for relief. No recent injuries or diagnosis of arthritis.  He mentions a history of foamy urine for years but has not been evaluated by urology. He previously had low testosterone  levels but is no longer pursuing treatment for it.   Patient Active Problem List   Diagnosis Date Noted   Eczema 08/29/2023   Psoriasis 08/29/2023   Insomnia 01/22/2021   Pulmonary nodules 03/09/2020   Essential hypertension 05/28/2016   Prediabetes 05/28/2016   Hyperlipidemia 05/28/2016   Hypogonadism in male 05/28/2016    Vitamin D  deficiency 05/28/2016   Erectile dysfunction 05/28/2016   Depression, major, recurrent, moderate (HCC) 05/28/2016   Past Medical History:  Diagnosis Date   Anxiety    Depression    HLD (hyperlipidemia)    HTN (hypertension)    Past Surgical History:  Procedure Laterality Date   FOOT FRACTURE SURGERY  1985   No Known Allergies       02/10/2024    9:57 AM 09/01/2023    9:11 AM 06/15/2019    4:15 PM  Depression screen PHQ 2/9  Decreased Interest 1 0 3  Down, Depressed, Hopeless 1 0 2  PHQ - 2 Score 2 0 5  Altered sleeping 1 0 1  Tired, decreased energy 1 0 1  Change in appetite 1 0 0  Feeling bad or failure about yourself  1 0 1  Trouble concentrating 1 0 0  Moving slowly or fidgety/restless 0 0 0  Suicidal thoughts 0 0 0  PHQ-9 Score 7 0 8  Difficult doing work/chores Somewhat difficult Not difficult at all Somewhat difficult       02/10/2024    9:57 AM 09/01/2023    9:12 AM  GAD 7 : Generalized Anxiety Score  Nervous, Anxious, on Edge 1 0  Control/stop worrying 1 0  Worry too much - different things 1 0  Trouble relaxing 1 0  Restless 1 0  Easily annoyed or irritable 0 0  Afraid - awful might happen 0 0  Total GAD 7 Score 5 0  Anxiety Difficulty Somewhat difficult Not difficult at all      Review of Systems  Constitutional:  Negative for chills and fever.  Respiratory:  Negative for shortness of breath.   Cardiovascular:  Negative for chest pain.  Gastrointestinal:  Negative for abdominal pain, constipation, diarrhea, heartburn, nausea and vomiting.  Genitourinary:  Negative for dysuria, frequency and urgency.  Musculoskeletal:  Positive for back pain.  Neurological:  Negative for dizziness and headaches.  Endo/Heme/Allergies:  Negative for polydipsia.  Psychiatric/Behavioral:  Negative for depression and suicidal ideas. The patient is not nervous/anxious.       Objective:     BP 124/80   Pulse 96   Temp (!) 97.3 F (36.3 C) (Temporal)    Ht 6' 1 (1.854 m)   Wt 293 lb (132.9 kg)   SpO2 96%   BMI 38.66 kg/m  BP Readings from Last 3 Encounters:  02/10/24 124/80  09/01/23 130/80  03/17/23 121/81   Wt Readings from Last 3 Encounters:  02/10/24 293 lb (132.9 kg)  09/01/23 297 lb (134.7 kg)  03/17/23 266 lb (120.7 kg)      Physical Exam Vitals and nursing note reviewed.  Constitutional:      Appearance: Normal appearance.  Cardiovascular:     Rate and Rhythm: Normal rate and regular rhythm.     Pulses: Normal pulses.     Heart sounds: Normal heart sounds.  Pulmonary:     Effort: Pulmonary effort is normal.     Breath sounds: Normal breath sounds.  Musculoskeletal:     Thoracic back: Tenderness present. No swelling or bony tenderness. Normal range of motion.  Neurological:     Mental Status: He is alert and oriented to person, place, and time.  Psychiatric:        Mood and Affect: Mood normal.        Behavior: Behavior normal.        Thought Content: Thought content normal.        Judgment: Judgment normal.      No results found for any visits on 02/10/24.     The 10-year ASCVD risk score (Arnett DK, et al., 2019) is: 4.7%    Assessment & Plan:  Mid back pain on right side -     predniSONE ; Please follow instructions on the package.  Dispense: 1 each; Refill: 0  Need for influenza vaccination -     Flu vaccine trivalent PF, 6mos and older(Flulaval,Afluria,Fluarix,Fluzone )    Assessment and Plan Assessment & Plan Middle back pain, right side Intermittent throbbing middle back pain on the right side, worsening over the last few days, affecting daily activities and sleep. Differential includes muscle pull, inflammation, or early arthritis. - Prescribe prednisone  taper.  - Advise use of over-the-counter Voltaren gel up to four times daily. - Recommend back exercises, handout provided. - Advise use of ice or heat for pain relief. - Instruct on use of Tylenol and ibuprofen for pain management. -  Schedule follow-up in two weeks to assess improvement. - Consider imaging if no improvement.    Return in about 2 weeks (around 02/24/2024) for back pain.Bernard Carrol Aurora, NP

## 2024-02-19 ENCOUNTER — Ambulatory Visit (INDEPENDENT_AMBULATORY_CARE_PROVIDER_SITE_OTHER)
Admission: RE | Admit: 2024-02-19 | Discharge: 2024-02-19 | Disposition: A | Discharge status: Home / Self Care | Source: Ambulatory Visit | Attending: General Practice | Admitting: General Practice

## 2024-02-19 ENCOUNTER — Ambulatory Visit: Payer: Self-pay | Admitting: General Practice

## 2024-02-19 ENCOUNTER — Encounter: Payer: Self-pay | Admitting: General Practice

## 2024-02-19 ENCOUNTER — Ambulatory Visit: Admitting: General Practice

## 2024-02-19 VITALS — BP 130/84 | HR 97 | Temp 97.1°F | Ht 73.0 in | Wt 288.8 lb

## 2024-02-19 DIAGNOSIS — M549 Dorsalgia, unspecified: Secondary | ICD-10-CM | POA: Diagnosis not present

## 2024-02-19 LAB — POC URINALSYSI DIPSTICK (AUTOMATED)
Bilirubin, UA: NEGATIVE
Blood, UA: NEGATIVE
Glucose, UA: NEGATIVE
Ketones, UA: NEGATIVE
Leukocytes, UA: NEGATIVE
Nitrite, UA: NEGATIVE
Protein, UA: NEGATIVE
Spec Grav, UA: >=1.03 — AB (ref 1.010–1.025)
Urobilinogen, UA: 0.2 U/dL
pH, UA: 5.5 (ref 5.0–8.0)

## 2024-02-19 MED ORDER — CYCLOBENZAPRINE HCL 5 MG PO TABS: 5.0000 mg | ORAL_TABLET | Freq: Three times a day (TID) | ORAL | 0 refills | Status: AC | PRN

## 2024-02-19 NOTE — Progress Notes (Signed)
 Established Patient Office Visit  Subjective   Patient ID: Bernard Thompson, male    DOB: 06/07/1971  Age: 53 y.o. MRN: [id number]  Chief Complaint  Patient presents with  . Back Pain    On right side; not any better then before. Patient states its not a constant pain but comes and goes. Patient feels like it hurts worse if he moves a certain way or kinda of like a pulled muscle. Patient does not take anything for sx.     Back Pain Pertinent negatives include no abdominal pain, chest pain, dysuria, fever or headaches.   Discussed the use of AI scribe software for clinical note transcription with the patient, who gave verbal consent to proceed.  History of Present Illness Bernard Thompson is a 53 year old male who presents with persistent back pain.  He has been experiencing persistent back pain for several months, which has gradually worsened. The pain is primarily located in the middle to upper right side of his back and is described as constant, intensifying with movements such as twisting, bending down, or getting up quickly. The pain does not radiate and remains localized to the described area.  He was evaluated on 02/10/24 and was given a prednisone  taper.   He completed a seven-day course of prednisone  a few days ago, which did not alleviate the pain. He has not tried heat or ice therapy, nor has he used Voltaren gel. He has performed some exercises, but these did not provide relief. He has not experienced any trauma or falls.      Patient Active Problem List   Diagnosis Date Noted  . Eczema 08/29/2023  . Psoriasis 08/29/2023  . Insomnia 01/22/2021  . Pulmonary nodules 03/09/2020  . Essential hypertension 05/28/2016  . Prediabetes 05/28/2016  . Hyperlipidemia 05/28/2016  . Hypogonadism in male 05/28/2016  . Vitamin D  deficiency 05/28/2016  . Erectile dysfunction 05/28/2016  . Depression, major, recurrent, moderate (HCC) 05/28/2016   Past Medical History:  Diagnosis Date  .  Anxiety   . Depression   . HLD (hyperlipidemia)   . HTN (hypertension)    Past Surgical History:  Procedure Laterality Date  . FOOT FRACTURE SURGERY  1985   No Known Allergies       02/19/2024    8:39 AM 02/10/2024    9:57 AM 09/01/2023    9:11 AM  Depression screen PHQ 2/9  Decreased Interest 1 1 0  Down, Depressed, Hopeless 1 1 0  PHQ - 2 Score 2 2 0  Altered sleeping 1 1 0  Tired, decreased energy 1 1 0  Change in appetite 1 1 0  Feeling bad or failure about yourself  1 1 0  Trouble concentrating 1 1 0  Moving slowly or fidgety/restless 0 0 0  Suicidal thoughts 0 0 0  PHQ-9 Score 7 7 0  Difficult doing work/chores Somewhat difficult Somewhat difficult Not difficult at all       02/19/2024    8:39 AM 02/10/2024    9:57 AM 09/01/2023    9:12 AM  GAD 7 : Generalized Anxiety Score  Nervous, Anxious, on Edge 1 1 0  Control/stop worrying 1 1 0  Worry too much - different things 1 1 0  Trouble relaxing 1 1 0  Restless 1 1 0  Easily annoyed or irritable 0 0 0  Afraid - awful might happen 0 0 0  Total GAD 7 Score 5 5 0  Anxiety Difficulty Somewhat difficult  Somewhat difficult Not difficult at all      Review of Systems  Constitutional:  Negative for chills and fever.  Respiratory:  Negative for shortness of breath.   Cardiovascular:  Negative for chest pain.  Gastrointestinal:  Negative for abdominal pain, constipation, diarrhea, heartburn, nausea and vomiting.  Genitourinary:  Negative for dysuria, frequency and urgency.  Musculoskeletal:  Positive for back pain.  Neurological:  Negative for dizziness and headaches.  Endo/Heme/Allergies:  Negative for polydipsia.  Psychiatric/Behavioral:  Negative for depression and suicidal ideas. The patient is not nervous/anxious.       Objective:     BP 130/84   Pulse 97   Temp (!) 97.1 F (36.2 C) (Temporal)   Ht 6' 1 (1.854 m)   Wt 288 lb 12.8 oz (131 kg)   SpO2 97%   BMI 38.10 kg/m  BP Readings from Last 3  Encounters:  02/19/24 130/84  02/10/24 124/80  09/01/23 130/80   Wt Readings from Last 3 Encounters:  02/19/24 288 lb 12.8 oz (131 kg)  02/10/24 293 lb (132.9 kg)  09/01/23 297 lb (134.7 kg)      Physical Exam Vitals and nursing note reviewed.  Constitutional:      Appearance: Normal appearance.  Cardiovascular:     Rate and Rhythm: Normal rate and regular rhythm.     Pulses: Normal pulses.     Heart sounds: Normal heart sounds.  Pulmonary:     Effort: Pulmonary effort is normal.     Breath sounds: Normal breath sounds.  Musculoskeletal:     Thoracic back: No swelling, edema, deformity, signs of trauma, spasms, tenderness or bony tenderness. Decreased range of motion.  Neurological:     Mental Status: He is alert and oriented to person, place, and time.  Psychiatric:        Mood and Affect: Mood normal.        Behavior: Behavior normal.        Thought Content: Thought content normal.        Judgment: Judgment normal.      Results for orders placed or performed in visit on 02/19/24  POCT Urinalysis Dipstick (Automated)  Result Value Ref Range   Color, UA amber    Clarity, UA clear    Glucose, UA Negative Negative   Bilirubin, UA neg    Ketones, UA neg    Spec Grav, UA >=1.030 (A) 1.010 - 1.025   Blood, UA neg    pH, UA 5.5 5.0 - 8.0   Protein, UA Negative Negative   Urobilinogen, UA 0.2 0.2 or 1.0 E.U./dL   Nitrite, UA neg    Leukocytes, UA Negative Negative       The 10-year ASCVD risk score (Arnett DK, et al., 2019) is: 5.1%    Assessment & Plan:  Mid back pain on right side -     Cyclobenzaprine  HCl; Take 1 tablet (5 mg total) by mouth 3 (three) times daily as needed.  Dispense: 30 tablet; Refill: 0 -     DG Thoracic Spine 2 View -     POCT Urinalysis Dipstick (Automated)    Assessment and Plan Assessment & Plan Right mid to upper back pain Chronic pain worsening over months, non-tender, movement-exacerbated. Differential includes muscular, renal,  or spinal causes. Prednisone  ineffective.  - POC UA is negative for leuks, nitrites, ketones.  - Xray pending. - Start Flexeril  5 mg TID PRN. Sedation precautions discussed. - Advise heat application.  - He will update next  week about his symptoms.  - Consider ortho referral if not better.   Return if symptoms worsen or fail to improve.    Carrol Aurora, NP

## 2024-02-19 NOTE — Patient Instructions (Signed)
 Complete xray(s) prior to leaving today. I will notify you of your results once received.  Start Flexeril  5 mg as needed for muscle pain.   Continue using heat as tolerated.   Follow up if symptoms worsen or do not improve.   Let me know next week.   It was a pleasure to see you today!

## 2024-02-24 ENCOUNTER — Ambulatory Visit: Admitting: General Practice

## 2024-02-28 ENCOUNTER — Other Ambulatory Visit: Payer: Self-pay | Admitting: Family

## 2024-02-28 DIAGNOSIS — I10 Essential (primary) hypertension: Secondary | ICD-10-CM

## 2024-03-04 ENCOUNTER — Encounter: Payer: 59 | Admitting: Nurse Practitioner

## 2024-03-05 ENCOUNTER — Encounter: Payer: Self-pay | Admitting: General Practice

## 2024-03-05 ENCOUNTER — Ambulatory Visit (INDEPENDENT_AMBULATORY_CARE_PROVIDER_SITE_OTHER): Admitting: General Practice

## 2024-03-05 VITALS — BP 120/84 | HR 97 | Temp 97.6°F | Ht 73.0 in | Wt 291.2 lb

## 2024-03-05 DIAGNOSIS — Z23 Encounter for immunization: Secondary | ICD-10-CM | POA: Diagnosis not present

## 2024-03-05 DIAGNOSIS — Z0000 Encounter for general adult medical examination without abnormal findings: Secondary | ICD-10-CM | POA: Diagnosis not present

## 2024-03-05 DIAGNOSIS — R7303 Prediabetes: Secondary | ICD-10-CM

## 2024-03-05 DIAGNOSIS — R918 Other nonspecific abnormal finding of lung field: Secondary | ICD-10-CM

## 2024-03-05 DIAGNOSIS — E559 Vitamin D deficiency, unspecified: Secondary | ICD-10-CM

## 2024-03-05 DIAGNOSIS — E785 Hyperlipidemia, unspecified: Secondary | ICD-10-CM

## 2024-03-05 DIAGNOSIS — I10 Essential (primary) hypertension: Secondary | ICD-10-CM

## 2024-03-05 MED ORDER — LOSARTAN POTASSIUM 50 MG PO TABS: ORAL_TABLET | ORAL | 1 refills | Status: AC

## 2024-03-05 NOTE — Addendum Note (Signed)
 Addended by: TENNIE RAISIN B on: 03/05/2024 04:06 PM   Modules accepted: Orders

## 2024-03-05 NOTE — Assessment & Plan Note (Signed)
 Order placed CT chest.  Consider pulmonology referral.

## 2024-03-05 NOTE — Assessment & Plan Note (Signed)
 Immunizations tdap UTD. Shingrix  2nd dose given. Pneumonia vaccine at next visit. Colonoscopy UTD,  PSA due and pending.  Discussed the importance of a healthy diet and regular exercise in order for weight loss, and to reduce the risk of further co-morbidity.  Exam stable. Labs pending.  Follow up in 1 year for repeat physical.

## 2024-03-05 NOTE — Assessment & Plan Note (Signed)
 Controlled.  Lipid panel pending.  Continue Crestor  5 mg once daily.

## 2024-03-05 NOTE — Progress Notes (Signed)
 Established Patient Office Visit  Subjective   Patient ID: Bernard Thompson, male    DOB: [birth year]  Age: 52 y.o. MRN: [id number]  Chief Complaint  Patient presents with   Annual Exam    With fasting labs    HPI  Bernard Thompson is a 53 year old male with past medical history of HTN, pulmonary nodules, eczema, HLD, psoriasis, prediabetes, ED, insomnia, depression, vitamin d  deficiency presents today for complete physical and follow up of chronic conditions.  Immunizations: -Tetanus: Completed in 2019 -Influenza: UTD -Shingles: completed first dose; due for second dose. -Pneumonia: due  Diet: Fair diet.  Exercise: No regular exercise.  Eye exam: Completes annually  Dental exam: Completes semi-annually    Colonoscopy: Completed in 2024  PSA: Due   Patient Active Problem List   Diagnosis Date Noted   Encounter for screening and preventative care 03/05/2024   Eczema 08/29/2023   Psoriasis 08/29/2023   Insomnia 01/22/2021   Pulmonary nodules 03/09/2020   Essential hypertension 05/28/2016   Prediabetes 05/28/2016   Hyperlipidemia 05/28/2016   Hypogonadism in male 05/28/2016   Vitamin D  deficiency 05/28/2016   Erectile dysfunction 05/28/2016   Depression, major, recurrent, moderate (HCC) 05/28/2016   Past Medical History:  Diagnosis Date   Anxiety    Depression    HLD (hyperlipidemia)    HTN (hypertension)    Past Surgical History:  Procedure Laterality Date   FOOT FRACTURE SURGERY  1985   No Known Allergies       03/05/2024    3:04 PM 02/19/2024    8:39 AM 02/10/2024    9:57 AM  Depression screen PHQ 2/9  Decreased Interest 1 1 1   Down, Depressed, Hopeless 1 1 1   PHQ - 2 Score 2 2 2   Altered sleeping 1 1 1   Tired, decreased energy 1 1 1   Change in appetite 1 1 1   Feeling bad or failure about yourself  1 1 1   Trouble concentrating 1 1 1   Moving slowly or fidgety/restless 0 0 0  Suicidal thoughts 0 0 0  PHQ-9 Score 7 7 7   Difficult doing work/chores  Somewhat difficult Somewhat difficult Somewhat difficult       03/05/2024    3:04 PM 02/19/2024    8:39 AM 02/10/2024    9:57 AM 09/01/2023    9:12 AM  GAD 7 : Generalized Anxiety Score  Nervous, Anxious, on Edge 1 1 1  0  Control/stop worrying 1 1 1  0  Worry too much - different things 1 1 1  0  Trouble relaxing 1 1 1  0  Restless 1 1 1  0  Easily annoyed or irritable 0 0 0 0  Afraid - awful might happen 0 0 0 0  Total GAD 7 Score 5 5 5  0  Anxiety Difficulty Somewhat difficult Somewhat difficult Somewhat difficult Not difficult at all      Review of Systems  Constitutional:  Negative for chills, fever, malaise/fatigue and weight loss.  HENT:  Negative for congestion, ear discharge, ear pain, hearing loss, nosebleeds, sinus pain, sore throat and tinnitus.   Eyes:  Negative for blurred vision, double vision, pain, discharge and redness.  Respiratory:  Negative for cough, shortness of breath, wheezing and stridor.   Cardiovascular:  Negative for chest pain, palpitations and leg swelling.  Gastrointestinal:  Negative for abdominal pain, constipation, diarrhea, heartburn, nausea and vomiting.  Genitourinary:  Negative for dysuria, frequency and urgency.  Musculoskeletal:  Negative for myalgias.  Skin:  Negative for  rash.  Neurological:  Negative for dizziness, tingling, seizures, weakness and headaches.  Psychiatric/Behavioral:  Negative for depression, substance abuse and suicidal ideas. The patient is not nervous/anxious.       Objective:     BP 120/84   Pulse 97   Temp 97.6 F (36.4 C) (Temporal)   Ht 6' 1 (1.854 m)   Wt 291 lb 3.2 oz (132.1 kg)   SpO2 100%   BMI 38.42 kg/m  BP Readings from Last 3 Encounters:  03/05/24 120/84  02/19/24 130/84  02/10/24 124/80   Wt Readings from Last 3 Encounters:  03/05/24 291 lb 3.2 oz (132.1 kg)  02/19/24 288 lb 12.8 oz (131 kg)  02/10/24 293 lb (132.9 kg)      Physical Exam Vitals and nursing note reviewed.  Constitutional:       Appearance: Normal appearance.  HENT:     Head: Normocephalic and atraumatic.     Right Ear: Tympanic membrane, ear canal and external ear normal.     Left Ear: Tympanic membrane, ear canal and external ear normal.     Nose: Nose normal.     Mouth/Throat:     Mouth: Mucous membranes are moist.     Pharynx: Oropharynx is clear.  Eyes:     Conjunctiva/sclera: Conjunctivae normal.     Pupils: Pupils are equal, round, and reactive to light.  Cardiovascular:     Rate and Rhythm: Normal rate and regular rhythm.     Pulses: Normal pulses.     Heart sounds: Normal heart sounds.  Pulmonary:     Effort: Pulmonary effort is normal.     Breath sounds: Normal breath sounds.  Abdominal:     General: Abdomen is flat. Bowel sounds are normal.     Palpations: Abdomen is soft.  Musculoskeletal:        General: Normal range of motion.     Cervical back: Normal range of motion.  Skin:    General: Skin is warm and dry.     Capillary Refill: Capillary refill takes less than 2 seconds.  Neurological:     General: No focal deficit present.     Mental Status: He is alert and oriented to person, place, and time. Mental status is at baseline.  Psychiatric:        Mood and Affect: Mood normal.        Behavior: Behavior normal.        Thought Content: Thought content normal.        Judgment: Judgment normal.      No results found for any visits on 03/05/24.     The 10-year ASCVD risk score (Arnett DK, et al., 2019) is: 4.4%    Assessment & Plan:  Encounter for screening and preventative care Assessment & Plan: Immunizations tdap UTD. Shingrix  2nd dose given. Pneumonia vaccine at next visit. Colonoscopy UTD,  PSA due and pending.  Discussed the importance of a healthy diet and regular exercise in order for weight loss, and to reduce the risk of further co-morbidity.  Exam stable. Labs pending.  Follow up in 1 year for repeat physical.   Orders: -     CBC -     Comprehensive  metabolic panel with GFR -     Lipid panel -     TSH -     PSA -     Hemoglobin A1c  Essential hypertension Assessment & Plan: Controlled.  Continue Losartan  50 once daily.  Refill sent.  Orders: -  Losartan  Potassium; Take  1 tablet  Daily  for BP                                                                                                   /                                        TAKE                                                    BY                                            MOUTH  Dispense: 90 tablet; Refill: 1  Pulmonary nodules Assessment & Plan: Order placed CT chest.  Consider pulmonology referral.   Orders: -     CT CHEST WO CONTRAST; Future  Vitamin D  deficiency Assessment & Plan: Vitamin D  level pending.  Orders: -     VITAMIN D  25 Hydroxy (Vit-D Deficiency, Fractures)  Hyperlipidemia, unspecified hyperlipidemia type Assessment & Plan: Controlled.  Lipid panel pending.  Continue Crestor  5 mg once daily.   Prediabetes Assessment & Plan: Hemoglobin A1c pending.     Return in about 6 months (around 09/02/2024) for chronic care management.SABRA Carrol Aurora, NP

## 2024-03-05 NOTE — Assessment & Plan Note (Signed)
 Controlled.  Continue Losartan  50 once daily.  Refill sent.

## 2024-03-05 NOTE — Assessment & Plan Note (Signed)
Vitamin D level pending

## 2024-03-05 NOTE — Patient Instructions (Signed)
 Stop by the lab prior to leaving today. I will notify you of your results once received.   Continue blood pressure medication as prescribed.   Someone will call you regarding the CT.   Follow up in 6 months unless you need me sooner.   It was a pleasure to see you today!

## 2024-03-05 NOTE — Assessment & Plan Note (Signed)
 Hemoglobin A1c pending

## 2024-03-06 LAB — COMPREHENSIVE METABOLIC PANEL WITH GFR
AG Ratio: 1.9 (calc) (ref 1.0–2.5)
ALT: 46 U/L (ref 9–46)
AST: 25 U/L (ref 10–35)
Albumin: 4.6 g/dL (ref 3.6–5.1)
Alkaline phosphatase (APISO): 106 U/L (ref 35–144)
BUN/Creatinine Ratio: 9 (calc) (ref 6–22)
BUN: 12 mg/dL (ref 7–25)
CO2: 27 mmol/L (ref 20–32)
Calcium: 10.1 mg/dL (ref 8.6–10.3)
Chloride: 100 mmol/L (ref 98–110)
Creat: 1.39 mg/dL — ABNORMAL HIGH (ref 0.70–1.30)
Globulin: 2.4 g/dL (ref 1.9–3.7)
Glucose, Bld: 113 mg/dL — ABNORMAL HIGH (ref 65–99)
Potassium: 4.7 mmol/L (ref 3.5–5.3)
Sodium: 137 mmol/L (ref 135–146)
Total Bilirubin: 0.9 mg/dL (ref 0.2–1.2)
Total Protein: 7 g/dL (ref 6.1–8.1)
eGFR: 60 mL/min/1.73m2 (ref >=60)

## 2024-03-06 LAB — CBC
HCT: 46.5 % (ref 38.5–50.0)
Hemoglobin: 15.9 g/dL (ref 13.2–17.1)
MCH: 29.3 pg (ref 27.0–33.0)
MCHC: 34.2 g/dL (ref 32.0–36.0)
MCV: 85.6 fL (ref 80.0–100.0)
MPV: 9.8 fL (ref 7.5–12.5)
Platelets: 301 Thousand/uL (ref 140–400)
RBC: 5.43 Million/uL (ref 4.20–5.80)
RDW: 13 % (ref 11.0–15.0)
WBC: 8 Thousand/uL (ref 3.8–10.8)

## 2024-03-06 LAB — VITAMIN D 25 HYDROXY (VIT D DEFICIENCY, FRACTURES): Vit D, 25-Hydroxy: 62 ng/mL (ref 30–100)

## 2024-03-06 LAB — LIPID PANEL
Cholesterol: 160 mg/dL (ref <200)
HDL: 49 mg/dL (ref >=40)
LDL Cholesterol (Calc): 84 mg/dL
Non-HDL Cholesterol (Calc): 111 mg/dL (ref <130)
Total CHOL/HDL Ratio: 3.3 (calc) (ref <5.0)
Triglycerides: 178 mg/dL — ABNORMAL HIGH (ref <150)

## 2024-03-06 LAB — PSA: PSA: 1.71 ng/mL (ref <=4.00)

## 2024-03-06 LAB — HEMOGLOBIN A1C
Hgb A1c MFr Bld: 7.2 % — ABNORMAL HIGH (ref <5.7)
Mean Plasma Glucose: 160 mg/dL
eAG (mmol/L): 8.9 mmol/L

## 2024-03-06 LAB — TSH: TSH: 3.87 m[IU]/L (ref 0.40–4.50)

## 2024-03-08 ENCOUNTER — Ambulatory Visit: Payer: Self-pay | Admitting: General Practice

## 2024-03-08 ENCOUNTER — Encounter: Payer: Self-pay | Admitting: *Deleted

## 2024-03-22 ENCOUNTER — Other Ambulatory Visit

## 2024-05-02 ENCOUNTER — Other Ambulatory Visit: Payer: Self-pay | Admitting: General Practice

## 2024-05-02 DIAGNOSIS — E785 Hyperlipidemia, unspecified: Secondary | ICD-10-CM

## 2024-05-18 ENCOUNTER — Ambulatory Visit: Admitting: General Practice

## 2024-05-25 ENCOUNTER — Ambulatory Visit: Admitting: General Practice

## 2024-06-14 ENCOUNTER — Encounter: Payer: Self-pay | Admitting: General Practice

## 2024-06-14 ENCOUNTER — Ambulatory Visit: Payer: Self-pay | Admitting: General Practice

## 2024-06-14 ENCOUNTER — Ambulatory Visit: Admitting: General Practice

## 2024-06-14 VITALS — BP 116/80 | HR 99 | Temp 97.0°F | Ht 73.0 in | Wt 288.2 lb

## 2024-06-14 DIAGNOSIS — F331 Major depressive disorder, recurrent, moderate: Secondary | ICD-10-CM | POA: Diagnosis not present

## 2024-06-14 DIAGNOSIS — R918 Other nonspecific abnormal finding of lung field: Secondary | ICD-10-CM | POA: Diagnosis not present

## 2024-06-14 DIAGNOSIS — I10 Essential (primary) hypertension: Secondary | ICD-10-CM

## 2024-06-14 DIAGNOSIS — E782 Mixed hyperlipidemia: Secondary | ICD-10-CM

## 2024-06-14 DIAGNOSIS — Z23 Encounter for immunization: Secondary | ICD-10-CM | POA: Diagnosis not present

## 2024-06-14 DIAGNOSIS — E1165 Type 2 diabetes mellitus with hyperglycemia: Secondary | ICD-10-CM | POA: Insufficient documentation

## 2024-06-14 LAB — POCT GLYCOSYLATED HEMOGLOBIN (HGB A1C): Hemoglobin A1C: 6.8 % — AB (ref 4.0–5.6)

## 2024-06-14 LAB — MICROALBUMIN / CREATININE URINE RATIO
Creatinine,U: 528.3 mg/dL
Microalb Creat Ratio: 8.3 mg/g (ref 0.0–30.0)
Microalb, Ur: 4.4 mg/dL — ABNORMAL HIGH (ref 0.7–1.9)

## 2024-06-14 MED ORDER — LANCET DEVICE MISC: 1.0000 | Freq: Three times a day (TID) | 0 refills | Status: AC

## 2024-06-14 MED ORDER — BLOOD GLUCOSE MONITORING SUPPL DEVI: 1.0000 | Freq: Three times a day (TID) | 0 refills | Status: AC

## 2024-06-14 MED ORDER — LANCETS MISC: 1.0000 | 0 refills | Status: AC

## 2024-06-14 MED ORDER — BLOOD GLUCOSE TEST VI STRP: 1.0000 | ORAL_STRIP | Freq: Three times a day (TID) | 0 refills | Status: AC

## 2024-06-14 NOTE — Patient Instructions (Addendum)
 Stop by the lab prior to leaving today. I will notify you of your results once received.    You will either be contacted via phone regarding your referral to pulmonary , or you may receive a letter on your MyChart portal from our referral team with instructions for scheduling an appointment. Please let us  know if you have not been contacted by anyone within two weeks.   It is important that you improve your diet. Please limit carbohydrates in the form of white bread, rice, pasta, sweets, fast food, fried food, sugary drinks, etc. Increase your consumption of fresh fruits and vegetables, whole grains, lean protein.  Ensure you are consuming 64 ounces of water daily.  Start monitoring your blood pressure daily, around the same time of day, for the next 2-3 weeks.  Ensure that you have rested for 30 minutes prior to checking your blood pressure.   Record your readings and notify me if you see numbers consistently at or above 130 on top and/or 90 on bottom.   Start checking your blood sugar levels.  Appropriate times to check your blood sugar levels are:  -Before any meal (breakfast, lunch, dinner) -Two hours after any meal (breakfast, lunch, dinner) -Bedtime  Record your readings and notify me if you continue to consistently run at or above 150.   Schedule diabetic eye exam.   Follow up in 6 months.   It was a pleasure to see you today!

## 2024-06-14 NOTE — Progress Notes (Signed)
 "  Established Patient Office Visit  Subjective   Patient ID: Bernard Thompson, male    DOB: 1970/12/04  Age: 54 y.o. MRN: [id number]  Chief Complaint  Patient presents with   Diabetes    Patient here for DM follow up; not currently on any DM meds.     Diabetes Pertinent negatives for hypoglycemia include no dizziness, headaches or nervousness/anxiousness. Pertinent negatives for diabetes include no chest pain and no polydipsia.    Bernard Thompson is a 54 year old male with past medical history of HTN, pulmonary nodules, psoriasis, vitamin d  deficiency, MDD, insomnia presents today to discuss DM.   Discussed the use of AI scribe software for clinical note transcription with the patient, who gave verbal consent to proceed.  History of Present Illness Bernard Thompson is a 54 year old male with type 2 diabetes who presents for follow-up on his elevated A1c levels.  He has a history of prediabetes, with a recent A1c level of 7.2 three months ago, indicating a transition to type 2 diabetes. He has not been monitoring his diet and has not started any medication for diabetes. He has not experienced any symptoms related to hyperglycemia and has not been on insulin  before. He drinks diet sodas but does consumes pasta's and whit bread.   He is currently taking losartan  daily for hypertension, Crestor  for hyperlipidemia, and Vraylar  for depression. He also takes Prozac 20 mg daily and uses Xanax  sparingly as needed. Trazodone , which he takes occasionally for sleep, causes leg pain, so he uses it infrequently.  He has a history of small bilateral lung nodules and a fatty liver, with a recent CT scan confirming the presence of these nodules. No symptoms such as dyspnea or respiratory distress. He has a past history of smoking for a short period during his teenage years.    Patient Active Problem List   Diagnosis Date Noted   Type 2 diabetes mellitus with hyperglycemia, without long-term current use of insulin   (HCC) 06/14/2024   Encounter for screening and preventative care 03/05/2024   Eczema 08/29/2023   Psoriasis 08/29/2023   Insomnia 01/22/2021   Pulmonary nodules 03/09/2020   Essential hypertension 05/28/2016   Prediabetes 05/28/2016   Hyperlipidemia 05/28/2016   Hypogonadism in male 05/28/2016   Vitamin D  deficiency 05/28/2016   Erectile dysfunction 05/28/2016   Depression, major, recurrent, moderate (HCC) 05/28/2016   Past Medical History:  Diagnosis Date   Anxiety    Depression    HLD (hyperlipidemia)    HTN (hypertension)    Past Surgical History:  Procedure Laterality Date   FOOT FRACTURE SURGERY  1985   Allergies[1]       06/14/2024    9:28 AM 03/05/2024    3:04 PM 02/19/2024    8:39 AM  Depression screen PHQ 2/9  Decreased Interest 1 1 1   Down, Depressed, Hopeless 1 1 1   PHQ - 2 Score 2 2 2   Altered sleeping 1 1 1   Tired, decreased energy 1 1 1   Change in appetite 1 1 1   Feeling bad or failure about yourself  1 1 1   Trouble concentrating 1 1 1   Moving slowly or fidgety/restless 0 0 0  Suicidal thoughts 0 0 0  PHQ-9 Score 7 7  7    Difficult doing work/chores Somewhat difficult Somewhat difficult Somewhat difficult     Data saved with a previous flowsheet row definition       06/14/2024    9:28 AM 03/05/2024  3:04 PM 02/19/2024    8:39 AM 02/10/2024    9:57 AM  GAD 7 : Generalized Anxiety Score  Nervous, Anxious, on Edge 1 1 1 1   Control/stop worrying 1 1 1 1   Worry too much - different things 1 1 1 1   Trouble relaxing 1 1 1 1   Restless 1 1 1 1   Easily annoyed or irritable 0 0 0 0  Afraid - awful might happen 0 0 0 0  Total GAD 7 Score 5 5 5 5   Anxiety Difficulty Somewhat difficult Somewhat difficult Somewhat difficult Somewhat difficult      Review of Systems  Constitutional:  Negative for chills and fever.  Respiratory:  Negative for shortness of breath.   Cardiovascular:  Negative for chest pain.  Gastrointestinal:  Negative for abdominal pain,  constipation, diarrhea, heartburn, nausea and vomiting.  Genitourinary:  Negative for dysuria, frequency and urgency.  Neurological:  Negative for dizziness and headaches.  Endo/Heme/Allergies:  Negative for polydipsia.  Psychiatric/Behavioral:  Negative for depression and suicidal ideas. The patient is not nervous/anxious.       Objective:     BP 116/80   Pulse 99   Temp (!) 97 F (36.1 C) (Temporal)   Ht 6' 1 (1.854 m)   Wt 288 lb 3.2 oz (130.7 kg)   SpO2 99%   BMI 38.02 kg/m  BP Readings from Last 3 Encounters:  06/14/24 116/80  03/05/24 120/84  02/19/24 130/84   Wt Readings from Last 3 Encounters:  06/14/24 288 lb 3.2 oz (130.7 kg)  03/05/24 291 lb 3.2 oz (132.1 kg)  02/19/24 288 lb 12.8 oz (131 kg)      Physical Exam Vitals and nursing note reviewed.  Constitutional:      Appearance: Normal appearance.  Cardiovascular:     Rate and Rhythm: Normal rate and regular rhythm.     Pulses: Normal pulses.     Heart sounds: Normal heart sounds.  Pulmonary:     Effort: Pulmonary effort is normal.     Breath sounds: Normal breath sounds.  Neurological:     Mental Status: He is alert and oriented to person, place, and time.  Psychiatric:        Mood and Affect: Mood normal.        Behavior: Behavior normal.        Thought Content: Thought content normal.        Judgment: Judgment normal.      No results found for any visits on 06/14/24.     The 10-year ASCVD risk score (Arnett DK, et al., 2019) is: 7.4%    Assessment & Plan:  Type 2 diabetes mellitus with hyperglycemia, without long-term current use of insulin  (HCC) -     POCT glycosylated hemoglobin (Hb A1C) -     Microalbumin / creatinine urine ratio  Need for pneumococcal 20-valent conjugate vaccination -     Pneumococcal conjugate vaccine 20-valent  Pulmonary nodules -     Pulmonary Visit    Assessment and Plan Assessment & Plan Type 2 diabetes mellitus A1c improved to 6.8, indicating better  control but still above target. Emphasized lifestyle modifications and risks of uncontrolled diabetes. Encouraged A1c target below 6.5. - Monitor blood glucose at home with provided supplies. - Schedule annual diabetic eye exam. - Urine ACR pending. - Provided dietary guidance and encouraged portion control. - Offered referral to diabetic educator if needed. - Schedule follow-up in 6 months to reassess A1c.  Essential hypertension Blood pressure  well-controlled in office. Discussed potential discrepancies in readings and advised home monitoring. - Monitor blood pressure at home with a properly fitting cuff.  Pulmonary nodules Small bilateral lung nodules identified on chest CT. Referred to pulmonology for further evaluation. - Referred to pulmonology for evaluation of lung nodules.  Hyperlipidemia Managed with Crestor  without side effects. - Continue Crestor  5 mg oral once daily. - LDL goal less than 70.   Depression Managed with Vraylar  and Prozac. Follow-up with psychiatry ongoing. Xanax  used sparingly. - Continue current psychiatric medications and follow-up with psychiatry.  General health maintenance Discussed importance of vaccinations and screenings. Pneumonia vaccine recommended due to age and diabetes. - Administered pneumonia vaccine.   No follow-ups on file.    Carrol Aurora, NP     [1] No Known Allergies  "
